# Patient Record
Sex: Male | Born: 1955 | Race: White | Hispanic: No | Marital: Married | Smoking: Current every day smoker
Health system: Southern US, Community
[De-identification: ages and names within clinical notes are randomized; demographics above are authoritative.]

## PROBLEM LIST (undated history)

## (undated) DIAGNOSIS — I10 Essential (primary) hypertension: Secondary | ICD-10-CM

## (undated) DIAGNOSIS — M5126 Other intervertebral disc displacement, lumbar region: Secondary | ICD-10-CM

---

## 2005-12-02 ENCOUNTER — Ambulatory Visit: Payer: Self-pay | Admitting: Family Medicine

## 2006-04-15 ENCOUNTER — Ambulatory Visit: Payer: Self-pay | Admitting: Family Medicine

## 2006-05-25 DIAGNOSIS — M545 Low back pain, unspecified: Secondary | ICD-10-CM | POA: Insufficient documentation

## 2006-05-25 DIAGNOSIS — F17200 Nicotine dependence, unspecified, uncomplicated: Secondary | ICD-10-CM | POA: Insufficient documentation

## 2007-01-18 ENCOUNTER — Ambulatory Visit: Payer: Self-pay | Admitting: Family Medicine

## 2007-01-18 ENCOUNTER — Encounter: Payer: Self-pay | Admitting: Family Medicine

## 2007-01-18 ENCOUNTER — Encounter: Admission: RE | Admit: 2007-01-18 | Discharge: 2007-01-18 | Payer: Self-pay | Admitting: Family Medicine

## 2007-01-18 DIAGNOSIS — M25579 Pain in unspecified ankle and joints of unspecified foot: Secondary | ICD-10-CM | POA: Insufficient documentation

## 2007-01-19 ENCOUNTER — Encounter: Payer: Self-pay | Admitting: Family Medicine

## 2007-01-19 LAB — CONVERTED CEMR LAB
Basophils Absolute: 0 10*3/uL (ref 0.0–0.1)
Basophils Relative: 1 % (ref 0–1)
Eosinophils Relative: 2 % (ref 0–5)
MCV: 98.6 fL (ref 78.0–100.0)
Monocytes Absolute: 0.6 10*3/uL (ref 0.2–0.7)
Neutro Abs: 4.7 10*3/uL (ref 1.7–7.7)
RBC: 4.37 M/uL (ref 4.22–5.81)
RDW: 12.5 % (ref 11.5–14.0)
WBC: 7.2 10*3/uL (ref 4.0–10.5)

## 2007-01-20 ENCOUNTER — Telehealth (INDEPENDENT_AMBULATORY_CARE_PROVIDER_SITE_OTHER): Payer: Self-pay | Admitting: *Deleted

## 2007-01-21 ENCOUNTER — Telehealth: Payer: Self-pay | Admitting: Family Medicine

## 2007-08-29 ENCOUNTER — Ambulatory Visit: Payer: Self-pay | Admitting: *Deleted

## 2007-09-01 ENCOUNTER — Telehealth: Payer: Self-pay | Admitting: Family Medicine

## 2007-09-05 ENCOUNTER — Telehealth: Payer: Self-pay | Admitting: Family Medicine

## 2007-09-05 DIAGNOSIS — R498 Other voice and resonance disorders: Secondary | ICD-10-CM | POA: Insufficient documentation

## 2007-09-30 ENCOUNTER — Encounter: Payer: Self-pay | Admitting: Family Medicine

## 2008-11-30 ENCOUNTER — Ambulatory Visit: Payer: Self-pay | Admitting: Family Medicine

## 2008-11-30 ENCOUNTER — Encounter: Admission: RE | Admit: 2008-11-30 | Discharge: 2008-11-30 | Payer: Self-pay | Admitting: Family Medicine

## 2008-12-03 ENCOUNTER — Encounter: Payer: Self-pay | Admitting: Family Medicine

## 2008-12-03 LAB — CONVERTED CEMR LAB
Basophils Relative: 1 % (ref 0–1)
Eosinophils Relative: 1 % (ref 0–5)
HCT: 45.8 % (ref 39.0–52.0)
MCHC: 33.4 g/dL (ref 30.0–36.0)
MCV: 102.2 fL — ABNORMAL HIGH (ref 78.0–100.0)
Monocytes Absolute: 1 10*3/uL (ref 0.1–1.0)
Monocytes Relative: 11 % (ref 3–12)
Neutro Abs: 6.8 10*3/uL (ref 1.7–7.7)
Platelets: 265 10*3/uL (ref 150–400)
Uric Acid, Serum: 7.5 mg/dL (ref 4.0–7.8)

## 2009-09-05 ENCOUNTER — Ambulatory Visit: Payer: Self-pay | Admitting: Family Medicine

## 2009-10-14 ENCOUNTER — Telehealth: Payer: Self-pay | Admitting: Family Medicine

## 2009-10-22 ENCOUNTER — Ambulatory Visit: Payer: Self-pay | Admitting: Family Medicine

## 2009-10-22 ENCOUNTER — Encounter: Admission: RE | Admit: 2009-10-22 | Discharge: 2009-10-22 | Payer: Self-pay | Admitting: Family Medicine

## 2009-10-22 DIAGNOSIS — M25569 Pain in unspecified knee: Secondary | ICD-10-CM | POA: Insufficient documentation

## 2010-09-16 NOTE — Assessment & Plan Note (Signed)
Summary: Sinusitis   Vital Signs:  Patient profile:   55 year old male Height:      65 inches Weight:      127 pounds BMI:     21.21 O2 Sat:      98 % on Room air Temp:     98.4 degrees F oral Pulse rate:   91 / minute BP sitting:   131 / 81  (left arm) Cuff size:   regular  Vitals Entered By: Kathlene November (September 05, 2009 12:50 PM)  O2 Flow:  Room air CC: head and sinus congestion, H/A, behind eyes hurt, some cough   Primary Care Provider:  Linford Arnold, C  CC:  head and sinus congestion, H/A, behind eyes hurt, and some cough.  History of Present Illness: head and sinus congestion, H/A, behind eyes hurt, some cough.  Day 6 of sxs. Pain mostly over nasal bridge. Lots of pressure in the head, worse with cough.  Tried several OTC products - no relief.  No fever or ST.  No GI sxs.    Current Medications (verified): 1)  Aleve 220 Mg Tabs (Naproxen Sodium) .... As Needed 2)  Goodys Body Pain 500-325 Mg Pack (Aspirin-Acetaminophen) .... Take As Needed  Allergies (verified): No Known Drug Allergies  Comments:  Nurse/Medical Assistant: The patient's medications and allergies were reviewed with the patient and were updated in the Medication and Allergy Lists. Kathlene November (September 05, 2009 12:52 PM)  Social History: Reviewed history from 05/25/2006 and no changes required. Personnel officer at [employer].  Smokes 2ppd/41yr, ETOH, no drus, 4 caffeinated drinks daily, weights/bike daily 3-4x/week.  Physical Exam  General:  Well-developed,well-nourished,in no acute distress; alert,appropriate and cooperative throughout examination Head:  Normocephalic and atraumatic without obvious abnormalities. No apparent alopecia or balding. Eyes:  No corneal or conjunctival inflammation noted. EOMI. Perrla.  Ears:  External ear exam shows no significant lesions or deformities.  Otoscopic examination reveals clear canals, tympanic membranes are intact bilaterally without bulging, retraction,  inflammation or discharge. Hearing is grossly normal bilaterally. Nose:  External nasal examination shows no deformity or inflammation. Nasal mucosa are pink and moist without lesions or exudates. Mouth:  Oral mucosa and oropharynx without lesions or exudates.  Teeth in good repair. Neck:  No deformities, masses, or tenderness noted. Lungs:  Normal respiratory effort, chest expands symmetrically. Lungs are clear to auscultation, no crackles or wheezes. Heart:  Normal rate and regular rhythm. S1 and S2 normal without gallop, murmur, click, rub or other extra sounds. Pulses:  Radial 2+  Skin:  no rashes.   Cervical Nodes:  No lymphadenopathy noted Psych:  Cognition and judgment appear intact. Alert and cooperative with normal attention span and concentration. No apparent delusions, illusions, hallucinations   Impression & Recommendations:  Problem # 1:  SINUSITIS - ACUTE-NOS (ICD-461.9)  His updated medication list for this problem includes:    Amoxicillin 500 Mg Cap (Amoxicillin) .Marland Kitchen... Take 1 capsule by mouth three times a day x 10 days  Instructed on treatment. Call if symptoms persist or worsen. Claerly smoking makes this worse. Pt understands.   Complete Medication List: 1)  Aleve 220 Mg Tabs (Naproxen sodium) .... As needed 2)  Goodys Body Pain 500-325 Mg Pack (Aspirin-acetaminophen) .... Take as needed 3)  Amoxicillin 500 Mg Cap (Amoxicillin) .... Take 1 capsule by mouth three times a day x 10 days Prescriptions: AMOXICILLIN 500 MG CAP (AMOXICILLIN) Take 1 capsule by mouth three times a day X 10 days  #30 x  0   Entered and Authorized by:   Nani Gasser MD   Signed by:   Nani Gasser MD on 09/05/2009   Method used:   Electronically to        [facility] [phone]* (retail)       [address] [place], [place]  [address]       Ph: [phone]       Fax: [phone]   RxID:   [id number]

## 2010-09-16 NOTE — Progress Notes (Signed)
Summary: Chantix  Phone Note Call from Patient Call back at Home Phone [phone]   Caller: Patient Call For: Jason Page Summary of Call: Company is starting a no smoking policy and wanted to know If he could have the Chantix called in on [facility] in [place] Initial call taken by: Kathlene November,  October 14, 2009 12:47 PM  Follow-up for Phone Call        Needs appt to discuss further. Unless we have discussed it before. I didn't see if documented.  Follow-up by: Jason Page,  October 14, 2009 12:53 PM  Additional Follow-up for Phone Call Additional follow up Details #1::        He said he had been on it before but did not stick with it but that his work will not let them smoke anywhere not any vehicles either so he feels now is the time and he said he would stick with it this time- has no choice Additional Follow-up by: Kathlene November,  October 14, 2009 12:54 PM    Additional Follow-up for Phone Call Additional follow up Details #2::    OK, no prob.  Have him f/u in 1 mo.  Follow-up by: Jason Page,  October 14, 2009 12:55 PM  New/Updated Medications: CHANTIX STARTING MONTH PAK 0.5 MG X 11 & 1 MG X 42 TABS (VARENICLINE TARTRATE) Take as directed Prescriptions: CHANTIX STARTING MONTH PAK 0.5 MG X 11 & 1 MG X 42 TABS (VARENICLINE TARTRATE) Take as directed  #1 pack x 0   Entered and Authorized by:   Jason Page   Signed by:   Jason Page on 10/14/2009   Method used:   Electronically to        [facility] [phone]* (retail)       [address] [place], [place]  [address]       Ph: [phone]       Fax: [phone]   RxID:   [id number]

## 2010-09-16 NOTE — Assessment & Plan Note (Signed)
Summary: SWOLLEN KNEE/   Vital Signs:  Patient profile:   55 year old male Height:      65 inches Weight:      128 pounds Pulse rate:   92 / minute BP sitting:   146 / 78  (left arm) Cuff size:   regular  Vitals Entered By: Kathlene November (October 22, 2009 3:37 PM) CC: right knee pain and swollen since started the 2nd round of Chantix   Primary Care Provider:  Linford Arnold, C  CC:  right knee pain and swollen since started the 2nd round of Chantix.  History of Present Illness: right knee pain and swollen since started the 2nd round of Chantix.  Once on second row of pills (2nd week) started noticing knee swelling. No trauma. top of knee  andmedial area is tender.  Taking excedrin for pain relief - helps some. Has gotten worse. STarted about 5 days ago.  Tried to rest and elevate it over the weekend.  Wose with acitivity. No real alleviating sxs.  Hx of gout.  No longer smoking on the Chantix and he is no longer drinking alcohol. Of note had a similar episode in the left ankle about a year ago with acute swelling with no trauma. At that time was treated with cochicine for presumptive gout and says the medication really helped but his serum uric acid levels were normal.  RA was mildy elevated.   Current Medications (verified): 1)  Aleve 220 Mg Tabs (Naproxen Sodium) .... As Needed 2)  Goodys Body Pain 500-325 Mg Pack (Aspirin-Acetaminophen) .... Take As Needed 3)  Chantix Starting Month Pak 0.5 Mg X 11 & 1 Mg X 42 Tabs (Varenicline Tartrate) .... Take As Directed  Allergies (verified): No Known Drug Allergies  Comments:  Nurse/Medical Assistant: The patient's medications and allergies were reviewed with the patient and were updated in the Medication and Allergy Lists. 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  Physical Exam  General:  Well-developed,well-nourished,in no acute distress; alert,appropriate and cooperative throughout examination Msk:  Righ knee with 2+ swelling mostly superior a ndmedial to the patella.  Tender over this area. No joint line tenderness. Increased warmth.  No skin lesions or erythema.  Decreased flexionsecondary to swelling. Unable to fully extend.  No laxity of the joint. Unable to get a good McMurrays.  Skin:  no rashes.   Psych:  Cognition and judgment appear intact. Alert and cooperative with normal attention span and concentration. No apparent delusions, illusions, hallucinations   Impression & Recommendations:  Problem # 1:  KNEE PAIN (ICD-719.46) Acute onset swelling and had similar sxs in his left ankle a year ago. I am still sus[icious of some rheum process or gout.  Will get xray to rule out fracture or foreign body.  If neg will refer to sports med for have fluid drawn off and possibly send for analysis for gout or pseudogout. He is not longer drinking so this helps. I don't think this is a reaction to chantix so continue for now especially since this is helping his 3 ppd habit. For now did refill the colchicine since he felt this provided dramatic relief with his ankle almost a year ago. Dont' take with any NSAIDs. Can take with Tyelnoll if needed. Elevate, ice, and compress.   His updated medication list for thisF  problem includes:    Aleve 220 Mg Tabs (Naproxen sodium) .Marland Kitchen... As needed    Goodys Body Pain 500-325 Mg Pack (Aspirin-acetaminophen) .Marland Kitchen... Take as needed  Orders: T-DG Knee 2 Views*R* ([id number])  Complete Medication List: 1)  Aleve 220 Mg Tabs (Naproxen sodium) .... As needed 2)  Goodys Body Pain 500-325 Mg Pack (Aspirin-acetaminophen) .... Take as needed 3)  Chantix Starting Month Pak 0.5 Mg X 11 & 1 Mg X 42 Tabs (Varenicline tartrate) .... Take as directed 4)  Colchicine 0.6 Mg Tabs (Colchicine) .... Start with 2 tabs by  mouth then  take  two times a day Prescriptions: COLCHICINE 0.6 MG TABS (COLCHICINE) start with 2 tabs by  mouth then take  two times a day  #20 x 0   Entered and Authorized by:   Nani Gasser MD   Signed by:   Nani Gasser MD on 10/22/2009   Method used:   Electronically to        [facility] [phone]* (retail)       [address] [place], [place]  [address]       Ph: [phone]       Fax: [phone]   RxID:   [id number]

## 2011-03-09 ENCOUNTER — Ambulatory Visit (INDEPENDENT_AMBULATORY_CARE_PROVIDER_SITE_OTHER): Payer: BC Managed Care – PPO | Admitting: Family Medicine

## 2011-03-09 ENCOUNTER — Encounter: Payer: Self-pay | Admitting: Family Medicine

## 2011-03-09 VITALS — BP 153/90 | HR 61 | Ht 65.0 in | Wt 127.0 lb

## 2011-03-09 DIAGNOSIS — M2550 Pain in unspecified joint: Secondary | ICD-10-CM

## 2011-03-09 MED ORDER — VARENICLINE TARTRATE 0.5 MG X 11 & 1 MG X 42 PO MISC: ORAL | Status: AC

## 2011-03-09 MED ORDER — DOXYCYCLINE HYCLATE 100 MG PO TABS: 100.0000 mg | ORAL_TABLET | Freq: Two times a day (BID) | ORAL | Status: AC

## 2011-03-09 NOTE — Progress Notes (Signed)
  Subjective:    Patient ID: Jason Page, Jason    DOB: [birth year], 55 y.o.   MRN: [id number]  HPI Says worked in the yard last night.    Review of Systems     Objective:   Physical Exam        Assessment & Plan:

## 2011-03-09 NOTE — Progress Notes (Signed)
  Subjective:    Patient ID: Jason Page, male    DOB: 1955-08-31, 55 y.o.   MRN: [id number]  HPI  Was working in the yard last night and this am woke up and his left hand first finger was very swollen and tender. Says couldn't even wear a work glove today and they are required to wear on the job.  Says not open wound. Never saw anything bite him. No hx of gout, etc.    Review of Systems     Objective:   Physical Exam    LT hand first finger has sig swelling over the dorsum of the DIP joint. Unalble to flex it secondary to swelling.  Slightly red, inc warmth.  No drainage or open wound.     Assessment & Plan:  DIP swelling and Pain - discussed that I am not sure if due to bug bite or not. Doesn't look like an abscess but it is very swollen. Will tx with doxy since right over the joint. Call if not improving or suddenly gets worse and will eval further with an xray. Consider may be an inflammatory condition not related to infection but only in single joint. Out of work for the rest of today. Recommend job where doesn't have to wear gloves for a couple of days until swelling is improved.

## 2011-03-12 ENCOUNTER — Ambulatory Visit
Admission: RE | Admit: 2011-03-12 | Discharge: 2011-03-12 | Disposition: A | Discharge status: Home / Self Care | Payer: BC Managed Care – PPO | Source: Ambulatory Visit | Attending: Family Medicine | Admitting: Family Medicine

## 2011-03-12 ENCOUNTER — Telehealth: Payer: Self-pay | Admitting: Family Medicine

## 2011-03-12 DIAGNOSIS — M79645 Pain in left finger(s): Secondary | ICD-10-CM

## 2011-03-12 NOTE — Telephone Encounter (Signed)
Pt called and said his LT pointer finger is worse than when he was seen. Painful, sore, swelled worse.  Was told to call if worsened.  Please advise. Plan:  Spoke with Dr. Linford Arnold and she would like the patient to go for xray for the finger pain of the LT pointer finger.  Pt informed and he wishes to go today.  Told him the order will be in the system and he can just go 8-5pm MON-FRI any day but would prefer he do today. Jarvis Newcomer, LPN [facility]

## 2011-03-13 ENCOUNTER — Telehealth: Payer: Self-pay | Admitting: Family Medicine

## 2011-03-13 DIAGNOSIS — M79646 Pain in unspecified finger(s): Secondary | ICD-10-CM

## 2011-03-13 NOTE — Telephone Encounter (Signed)
Pt called and left mess on triage nurse VM asking for a return call. Plan:  Pt call was returned and the nurse had to [facility] for the pt and left office number for him to call back at his convenience. Jarvis Newcomer, LPN [facility]

## 2011-03-13 NOTE — Telephone Encounter (Signed)
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

## 2011-03-16 ENCOUNTER — Telehealth: Payer: Self-pay | Admitting: Family Medicine

## 2011-03-16 NOTE — Telephone Encounter (Signed)
Pt called and said he would like to know what he can do about the swelling until his referral appt on Thursday. Plan:  Told pt may use cold paks not directly to skin several times a day for 15 minute intervals, and elevate the hand as well.  Pt didn't want anything for the pain. Jason Newcomer, LPN [facility]

## 2011-09-08 ENCOUNTER — Encounter: Payer: Self-pay | Admitting: Physician Assistant

## 2011-09-08 ENCOUNTER — Ambulatory Visit (INDEPENDENT_AMBULATORY_CARE_PROVIDER_SITE_OTHER): Payer: BC Managed Care – PPO | Admitting: Physician Assistant

## 2011-09-08 VITALS — BP 174/95 | HR 91 | Temp 98.4°F | Wt 131.0 lb

## 2011-09-08 DIAGNOSIS — F17200 Nicotine dependence, unspecified, uncomplicated: Secondary | ICD-10-CM

## 2011-09-08 DIAGNOSIS — Z720 Tobacco use: Secondary | ICD-10-CM

## 2011-09-08 DIAGNOSIS — I10 Essential (primary) hypertension: Secondary | ICD-10-CM

## 2011-09-08 DIAGNOSIS — J40 Bronchitis, not specified as acute or chronic: Secondary | ICD-10-CM

## 2011-09-08 MED ORDER — AZITHROMYCIN 250 MG PO TABS: ORAL_TABLET | ORAL | Status: AC

## 2011-09-08 NOTE — Progress Notes (Signed)
  Subjective:    Patient ID: Jason Page, male    DOB: 03/21/1956, 56 y.o.   MRN: [id number]  Cough This is a new problem. The current episode started 1 to 4 weeks ago. The problem has been gradually worsening. The problem occurs every few minutes. The cough is productive of sputum. Associated symptoms include chills, ear congestion, ear pain, headaches, a sore throat, shortness of breath, sweats and wheezing. Pertinent negatives include no chest pain, fever, heartburn, hemoptysis, myalgias, nasal congestion, postnasal drip, rash or rhinorrhea. The symptoms are aggravated by cold air. Risk factors for lung disease include smoking/tobacco exposure and occupational exposure. He has tried OTC cough suppressant for the symptoms. The treatment provided mild relief. There is no history of asthma, bronchitis, COPD or pneumonia.   Patient also complains of sinus pressure for 3 weeks. Started taking alka selters, nyquil, dayquil. Not helped. Past weekend stayed in bed. He has been hoarse for 2 days. He complains of a headache that will not go away. 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  He recently had work physical and blood pressure was normal. Decreased cigarretes from 3 packs a day to 4 a day since been on Chantix.       Review of Systems  Constitutional: Positive for chills. Negative for fever.  HENT: Positive for ear pain and sore throat. Negative for rhinorrhea and postnasal drip.   Respiratory: Positive for cough, shortness of breath and wheezing. Negative for hemoptysis.   Cardiovascular: Negative for chest pain.  Gastrointestinal: Negative for heartburn.  Musculoskeletal: Negative for myalgias.  Skin: Negative for rash.  Neurological: Positive for headaches.       Objective:   Physical Exam  Constitutional: He is oriented to person, place, and time. He appears well-developed and well-nourished.  HENT:  Head: Normocephalic and  atraumatic.  Right Ear: External ear normal.  Left Ear: External ear normal.  Nose: Nose normal.  Mouth/Throat: Oropharynx is clear and moist. No oropharyngeal exudate.       Dentures on top palate.  Eyes: Right eye exhibits no discharge. Left eye exhibits no discharge.  Neck: Normal range of motion.       Bilateral anterior cervical adenopathy without tenderness to palpation.  Cardiovascular: Normal rate and normal heart sounds.   Pulmonary/Chest: Effort normal. He has wheezes.       Bilateral wheezing throughout lung fields on expiration.  Lymphadenopathy:    He has cervical adenopathy.  Neurological: He is alert and oriented to person, place, and time.  Skin: Skin is warm and dry.  Psychiatric: He has a normal mood and affect. His behavior is normal.          Assessment & Plan:  Bronchitis- Pulse ox: 97%. Zpak was given. Symptomatic care for cough. Mucinex BID might help with some of his congestion.  Hypertension-Patient reported was checked a work physical recently and was normal. Encourage patient to check daily. If it continues to be high or he has a headache it can't get rid of  then he needs to schedule an appointment.   Tobacco Abuse- On Chantix. Doing well on 4 cigarretes daily. Encouraged patient to continue to decrease cig amounts everyday that this will affect his overall health positively.

## 2011-09-08 NOTE — Patient Instructions (Signed)
Out of work for tomorrow. Start Zpak. Can try Mucinex for sinus congestion. Call office if not improving Friday.

## 2011-10-13 LAB — LIPID PANEL
Cholesterol: 198 mg/dL (ref 0–200)
HDL: 57 mg/dL (ref 35–70)
LDL Cholesterol: 110 mg/dL
LDl/HDL Ratio: 1.9

## 2011-10-13 LAB — BASIC METABOLIC PANEL
BUN: 9 mg/dL (ref 4–21)
Glucose: 84 mg/dL
Potassium: 4.4 mmol/L (ref 3.4–5.3)

## 2011-11-20 ENCOUNTER — Telehealth: Payer: Self-pay | Admitting: *Deleted

## 2011-11-20 NOTE — Telephone Encounter (Signed)
Pt called bc he has chest pain and R arm pain. He states he's been waking up w/ heart burn and acid reflux. Pt states its a burning in his upper chest and throat and bad taste in his mouth. Pt also c/o R arm pain. Pt describes as hard lump near elbow and pain started after sleeping on arm wrong. I advised Pt to avoid fatty, acidic, spicy foods and late night eating. Also advised Pt to schedule OV for next week and if Sxs get worse or chest pain becomes severe go to UC or ED. Pt agreed and scheduled OV.

## 2011-11-23 ENCOUNTER — Encounter: Payer: Self-pay | Admitting: Family Medicine

## 2011-11-23 ENCOUNTER — Ambulatory Visit (INDEPENDENT_AMBULATORY_CARE_PROVIDER_SITE_OTHER): Payer: BC Managed Care – PPO | Admitting: Family Medicine

## 2011-11-23 VITALS — BP 145/77 | HR 86 | Temp 98.4°F | Ht 65.0 in | Wt 133.0 lb

## 2011-11-23 DIAGNOSIS — K219 Gastro-esophageal reflux disease without esophagitis: Secondary | ICD-10-CM

## 2011-11-23 MED ORDER — OMEPRAZOLE 40 MG PO CPDR: 40.0000 mg | DELAYED_RELEASE_CAPSULE | Freq: Every day | ORAL | Status: DC

## 2011-11-23 NOTE — Progress Notes (Signed)
  Subjective:    Patient ID: Jason Page, male    DOB: [birth year], 56 y.o.   MRN: [id number]  HPI Severe heart burn . Has quit smoking. Has been eaing a lot.  Heatburn has  Been waking him at night. Has tried TUMs and helped some.  No blood in the stool. No vomiting.  Can happen with activity.  Does drink caffeine.     Review of Systems     Objective:   Physical Exam  Constitutional: He is oriented to person, place, and time. He appears well-developed and well-nourished.  HENT:  Head: Normocephalic and atraumatic.  Cardiovascular: Normal rate, regular rhythm and normal heart sounds.   Pulmonary/Chest: Effort normal and breath sounds normal.  Abdominal: Soft. Bowel sounds are normal. He exhibits no distension and no mass. There is no tenderness. There is no rebound and no guarding.  Neurological: He is alert and oriented to person, place, and time.  Skin: Skin is warm and dry.  Psychiatric: He has a normal mood and affect. His behavior is normal.          Assessment & Plan:  Severe heartburn - will tx with dietary changes and PPI.  Will check for H pylori.  Start omeprazole 40 mg once a day. He does not have significant symptom relief within one week and please call the office. He'll take the medication for 6-8 weeks and then taper down. Discussed the importance of dietary changes or otherwise the symptoms will come right back as soon as he discontinues the medication.   He has had cholesterol blood work checked at work this year. Asked him to drop off a copy for [place] to have.

## 2011-11-23 NOTE — Patient Instructions (Signed)
Diet for GERD or PUD Nutrition therapy can help ease the discomfort of gastroesophageal reflux disease (GERD) and peptic ulcer disease (PUD).  HOME CARE INSTRUCTIONS   Eat your meals slowly, in a relaxed setting.   Eat 5 to 6 small meals per day.   If a food causes distress, stop eating it for a period of time.  FOODS TO AVOID  Coffee, regular or decaffeinated.   Cola beverages, regular or low calorie.   Tea, regular or decaffeinated.   Pepper.   Cocoa.   High fat foods, including meats.   Butter, margarine, hydrogenated oil (trans fats).   Peppermint or spearmint (if you have GERD).   Fruits and vegetables if not tolerated.   Alcohol.   Nicotine (smoking or chewing). This is one of the most potent stimulants to acid production in the gastrointestinal tract.   Any food that seems to aggravate your condition.  If you have questions regarding your diet, ask your caregiver or a registered dietitian. TIPS  Lying flat may make symptoms worse. Keep the head of your bed raised 6 to 9 inches (15 to 23 cm) by using a foam wedge or blocks under the legs of the bed.   Do not lay down until 3 hours after eating a meal.   Daily physical activity may help reduce symptoms.  MAKE SURE YOU:   Understand these instructions.   Will watch your condition.   Will get help right away if you are not doing well or get worse.  Document Released: 08/03/2005 Document Revised: 07/23/2011 Document Reviewed: 06/19/2011 Exit[employer] Patient Information 2012 [employer]. 

## 2011-11-23 NOTE — Progress Notes (Signed)
Addended by: Nani Gasser D on: 11/23/2011 04:00 PM   Modules accepted: Orders

## 2011-11-24 ENCOUNTER — Encounter: Payer: Self-pay | Admitting: *Deleted

## 2011-11-24 LAB — H. PYLORI ANTIBODY, IGG: H Pylori IgG: 0.53 {ISR}

## 2012-09-12 ENCOUNTER — Other Ambulatory Visit: Payer: Self-pay | Admitting: Sports Medicine

## 2012-09-12 ENCOUNTER — Ambulatory Visit (INDEPENDENT_AMBULATORY_CARE_PROVIDER_SITE_OTHER): Payer: BC Managed Care – PPO | Admitting: Sports Medicine

## 2012-09-12 ENCOUNTER — Ambulatory Visit (INDEPENDENT_AMBULATORY_CARE_PROVIDER_SITE_OTHER): Payer: BC Managed Care – PPO

## 2012-09-12 DIAGNOSIS — M25469 Effusion, unspecified knee: Secondary | ICD-10-CM

## 2012-09-12 DIAGNOSIS — M25569 Pain in unspecified knee: Secondary | ICD-10-CM

## 2012-09-12 DIAGNOSIS — M25561 Pain in right knee: Secondary | ICD-10-CM

## 2012-09-12 MED ORDER — DOXYCYCLINE HYCLATE 100 MG PO TABS: 100.0000 mg | ORAL_TABLET | Freq: Two times a day (BID) | ORAL | Status: AC

## 2012-09-12 NOTE — Progress Notes (Signed)
SPORTS MEDICINE CONSULTATION REPORT  Subjective:    I'm seeing this patient as a consultation for:  Dr. Linford Arnold  CC: Right knee pain  HPI: This is a very pleasant 57 year old who comes in with a several-day history of pain he localizes over his lateral aspect of the right knee. He notes swelling, and warmth, has not had any injuries. He has had an episode like this before, and did have an x-ray that showed a knee effusion.  He notes that he has also had gout in his foot, but was unaware as to whether he has had this in his knee.  He denies any fevers or chills. He denies mechanical symptoms. Tried some over-the-counter pain relievers which were ineffective. Pain is localized, doesn't radiate, it is severe.  Past medical history, Surgical history, Family history not pertinant except as noted below, Social history, Allergies, and medications have been entered into the medical record, reviewed, and no changes needed.   Review of Systems: No headache, visual changes, nausea, vomiting, diarrhea, constipation, dizziness, abdominal pain, skin rash, fevers, chills, night sweats, weight loss, swollen lymph nodes, body aches, joint swelling, muscle aches, chest pain, shortness of breath, mood changes, visual or auditory hallucinations.   Objective:   General: Well Developed, well nourished, and in no acute distress.  Neuro/Psych: Alert and oriented x3, extra-ocular muscles intact, able to move all 4 extremities, sensation grossly intact. Skin: Warm and dry, no rashes noted.  Respiratory: Not using accessory muscles, speaking in full sentences, trachea midline.  Cardiovascular: Pulses palpable, no extremity edema. Abdomen: Does not appear distended. Right Knee: The knee is warm to palpation with a mild effusion palpable. There is no induration. ROM full in flexion and extension and lower leg rotation. Ligaments with solid consistent endpoints including ACL, PCL, LCL, MCL. Negative Mcmurray's, Apley's,  and Thessalonian tests. Non painful patellar compression. Patellar glide without crepitus. Patellar and quadriceps tendons unremarkable. Hamstring and quadriceps strength is normal.   Procedure: Real-time Ultrasound Guided aspiration/injection of right knee Device: GE Logiq E  Ultrasound guided injection is preferred based studies that show increased duration, increased effect, greater accuracy, decreased procedural pain, increased response rate, and decreased cost with ultrasound guided versus blind injection.  Verbal informed consent obtained.  Time-out conducted.  Noted no overlying erythema, induration, or other signs of local infection.  Skin prepped in a sterile fashion.  Local anesthesia: Topical Ethyl chloride.  With sterile technique and under real time ultrasound guidance:  4 cc lidocaine with epinephrine infiltrated in the subcutaneous tissues for local anesthesia. An 18-gauge needle a 60 cc syringe was advanced under real-time guidance into the suprapatellar recess, 6 cc of straw-colored fluid was aspirated. Syringe was switched, and 2 cc Kenalog 40, 4 cc lidocaine injected into the knee joint. Fluid was sent off to the lab. Completed without difficulty  Pain immediately resolved suggesting accurate placement of the medication.  Advised to call if fevers/chills, erythema, induration, drainage, or persistent bleeding.  Images permanently stored and available for review in the ultrasound unit.  Impression: Technically successful ultrasound guided injection.  X-rays show no degenerative change, they do show an effusion.  Impression and Recommendations:   This case required medical decision making of moderate complexity.

## 2012-09-12 NOTE — Assessment & Plan Note (Addendum)
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 Aspiration, with fluid sent for cell count, culture, sensitivity, Gram stain, and crystal analysis. I injected his knee. I strapped the knee with Ace bandage. He will go for x-rays, I'm also checking uric acid levels and a CBC with differential. As there is only minimal evidence of a cellulitis,we are also going to add doxycycline. I would like to see him back in 3-4 weeks.

## 2012-09-13 LAB — CBC WITH DIFFERENTIAL/PLATELET
Basophils Absolute: 0.1 K/uL (ref 0.0–0.1)
Basophils Relative: 1 % (ref 0–1)
Eosinophils Absolute: 0.1 10*3/uL (ref 0.0–0.7)
Eosinophils Relative: 1 % (ref 0–5)
HCT: 40.5 % (ref 39.0–52.0)
Hemoglobin: 14.2 g/dL (ref 13.0–17.0)
Lymphocytes Relative: 20 % (ref 12–46)
Lymphs Abs: 1.8 10*3/uL (ref 0.7–4.0)
MCH: 32.9 pg (ref 26.0–34.0)
MCHC: 35.1 g/dL (ref 30.0–36.0)
MCV: 94 fL (ref 78.0–100.0)
Monocytes Absolute: 0.8 10*3/uL (ref 0.1–1.0)
Monocytes Relative: 8 % (ref 3–12)
Neutro Abs: 6.4 10*3/uL (ref 1.7–7.7)
Neutrophils Relative %: 70 % (ref 43–77)
Platelets: 249 K/uL (ref 150–400)
RBC: 4.31 MIL/uL (ref 4.22–5.81)
RDW: 13.3 % (ref 11.5–15.5)
WBC: 9 K/uL (ref 4.0–10.5)

## 2012-09-13 LAB — URIC ACID: Uric Acid, Serum: 5.9 mg/dL (ref 4.0–7.8)

## 2012-09-15 ENCOUNTER — Telehealth: Payer: Self-pay | Admitting: *Deleted

## 2012-09-15 NOTE — Telephone Encounter (Signed)
I was informed by Renae Fickle in lab today that they can't run the test on the joint fluid b/c [facility] told him it had to be in a green top heparin tube.

## 2012-09-15 NOTE — Telephone Encounter (Signed)
Message copied by Renea Ee on Thu Sep 15, 2012  1:33 PM ------      Message from: Monica Becton      Created: Tue Sep 13, 2012 11:09 AM      Regarding: FW: Cancellation of Order # [id number]       Hi Misty, please call lab and see why they cancelled these joint fluid tests?      -T            ----- Message -----         From: [facility]         Sent: 09/13/2012   3:32 AM           To: Monica Becton, MD      Subject: Cancellation of Order # [id number]                         Order number [id number] for the procedure CELL COUNT + DIFF,  W/       [facility] FLD [[id number]] has been canceled by [facility] [[id number]]. This procedure was ordered by Monica Becton, MD [[id number]] on Sep 12, 2012 for the patient       Jason Page [[id number]]. The reason for cancellation was       "None".

## 2012-09-16 LAB — BODY FLUID CULTURE
Gram Stain: NONE SEEN
Organism ID, Bacteria: NO GROWTH

## 2012-10-06 ENCOUNTER — Telehealth: Payer: Self-pay | Admitting: *Deleted

## 2012-10-06 MED ORDER — DOXYCYCLINE HYCLATE 100 MG PO TABS: 100.0000 mg | ORAL_TABLET | Freq: Two times a day (BID) | ORAL | Status: AC

## 2012-10-06 NOTE — Telephone Encounter (Signed)
Called in another week of doxy, return if no better after.

## 2012-10-06 NOTE — Telephone Encounter (Signed)
Pt.notified

## 2012-10-06 NOTE — Telephone Encounter (Signed)
Pt calls & states that you gave him an abx for his knee.  He states that it felt better while he was taking it but now that he's finished it's hurting bad again.  He has a f/u with you on Monday but is asking if you can possibly call him in something again? Please advise

## 2012-10-10 ENCOUNTER — Ambulatory Visit (INDEPENDENT_AMBULATORY_CARE_PROVIDER_SITE_OTHER): Payer: BC Managed Care – PPO | Admitting: Sports Medicine

## 2012-10-10 DIAGNOSIS — M25561 Pain in right knee: Secondary | ICD-10-CM

## 2012-10-10 DIAGNOSIS — M25569 Pain in unspecified knee: Secondary | ICD-10-CM

## 2012-10-10 NOTE — Assessment & Plan Note (Signed)
Completely resolved status post aspiration, injection, doxycycline. Return as needed.

## 2012-10-10 NOTE — Progress Notes (Signed)
  Subjective:    CC: Followup knee.  HPI: This pleasant gentleman comes back for followup of right knee pain. I aspirated and injected his knee at the last visit as well as placed on a short course of doxycycline as there was mild erythema. Culture results from the aspiration were negative. We were unable to perform crystal analysis with polarized microscopy. He returns today completely pain-free.  Past medical history, Surgical history, Family history not pertinant except as noted below, Social history, Allergies, and medications have been entered into the medical record, reviewed, and no changes needed.   Review of Systems: No headache, visual changes, nausea, vomiting, diarrhea, constipation, dizziness, abdominal pain, skin rash, fevers, chills, night sweats, weight loss, swollen lymph nodes, body aches, joint swelling, muscle aches, chest pain, shortness of breath, mood changes, visual or auditory hallucinations.   Objective:   General: Well Developed, well nourished, and in no acute distress.  Neuro/Psych: Alert and oriented x3, extra-ocular muscles intact, able to move all 4 extremities, sensation grossly intact. Skin: Warm and dry, no rashes noted.  Respiratory: Not using accessory muscles, speaking in full sentences, trachea midline.  Cardiovascular: Pulses palpable, no extremity edema. Abdomen: Does not appear distended. Right Knee: Normal to inspection with no erythema or effusion or obvious bony abnormalities. Palpation normal with no warmth, joint line tenderness, patellar tenderness, or condyle tenderness. ROM full in flexion and extension and lower leg rotation. Ligaments with solid consistent endpoints including ACL, PCL, LCL, MCL. Negative Mcmurray's, Apley's, and Thessalonian tests. Non painful patellar compression. Patellar glide without crepitus. Patellar and quadriceps tendons unremarkable. Hamstring and quadriceps strength is normal.  Impression and Recommendations:     This case required medical decision making of moderate complexity.

## 2013-03-21 ENCOUNTER — Ambulatory Visit (INDEPENDENT_AMBULATORY_CARE_PROVIDER_SITE_OTHER): Payer: BC Managed Care – PPO | Admitting: Sports Medicine

## 2013-03-21 ENCOUNTER — Encounter: Payer: Self-pay | Admitting: Sports Medicine

## 2013-03-21 VITALS — BP 157/81 | HR 81 | Wt 120.0 lb

## 2013-03-21 DIAGNOSIS — T148XXA Other injury of unspecified body region, initial encounter: Secondary | ICD-10-CM

## 2013-03-21 DIAGNOSIS — W5501XA Bitten by cat, initial encounter: Secondary | ICD-10-CM | POA: Insufficient documentation

## 2013-03-21 MED ORDER — DOXYCYCLINE HYCLATE 100 MG PO TABS: 100.0000 mg | ORAL_TABLET | Freq: Two times a day (BID) | ORAL | Status: AC

## 2013-03-21 NOTE — Progress Notes (Signed)
  Subjective:    CC: Bite  HPI: This pleasant 57 year old male was bitten by his wife's cat several days ago, he developed some swelling, redness, pain without constitutional symptoms, pain is localized, moderate, persistent without radiation, swelling is overall improving.  Past medical history, Surgical history, Family history not pertinant except as noted below, Social history, Allergies, and medications have been entered into the medical record, reviewed, and no changes needed.   Review of Systems: No fevers, chills, night sweats, weight loss, chest pain, or shortness of breath.   Objective:    General: Well Developed, well nourished, and in no acute distress.  Neuro: Alert and oriented x3, extra-ocular muscles intact, sensation grossly intact.  HEENT: Normocephalic, atraumatic, pupils equal round reactive to light, neck supple, no masses, no lymphadenopathy, thyroid nonpalpable.  Skin: Warm and dry, no rashes. There is evidence of a cat bite over his left arm, there is also some induration, erythema, and tenderness to palpation suggestive of a cellulitis. No lymphangitis visible or palpable. Cardiac: Regular rate and rhythm, no murmurs rubs or gallops, no lower extremity edema.  Respiratory: Clear to auscultation bilaterally. Not using accessory muscles, speaking in full sentences. Impression and Recommendations:

## 2013-03-21 NOTE — Patient Instructions (Addendum)
Cellulitis Cellulitis is an infection of the skin and the tissue beneath it. The infected area is usually red and tender. Cellulitis occurs most often in the arms and lower legs.  CAUSES  Cellulitis is caused by bacteria that enter the skin through cracks or cuts in the skin. The most common types of bacteria that cause cellulitis are Staphylococcus and Streptococcus. SYMPTOMS   Redness and warmth.  Swelling.  Tenderness or pain.  Fever. DIAGNOSIS  Your caregiver can usually determine what is wrong based on a physical exam. Blood tests may also be done. TREATMENT  Treatment usually involves taking an antibiotic medicine. HOME CARE INSTRUCTIONS   Take your antibiotics as directed. Finish them even if you start to feel better.  Keep the infected arm or leg elevated to reduce swelling.  Apply a warm cloth to the affected area up to 4 times per day to relieve pain.  Only take over-the-counter or prescription medicines for pain, discomfort, or fever as directed by your caregiver.  Keep all follow-up appointments as directed by your caregiver. SEEK MEDICAL CARE IF:   You notice red streaks coming from the infected area.  Your red area gets larger or turns dark in color.  Your bone or joint underneath the infected area becomes painful after the skin has healed.  Your infection returns in the same area or another area.  You notice a swollen bump in the infected area.  You develop new symptoms. SEEK IMMEDIATE MEDICAL CARE IF:   You have a fever.  You feel very sleepy.  You develop vomiting or diarrhea.  You have a general ill feeling (malaise) with muscle aches and pains. MAKE SURE YOU:   Understand these instructions.  Will watch your condition.  Will get help right away if you are not doing well or get worse. Document Released: 05/13/2005 Document Revised: 02/02/2012 Document Reviewed: 10/19/2011 [employer] Patient Information 2014 [employer].  

## 2013-03-21 NOTE — Assessment & Plan Note (Signed)
This has developed a mild superficial cellulitis. Was causative agent is Staph, though we do need to worry about pasteurella multocida. Doxycycline for 7 days, warm compresses, return as needed. Up-to-date on tetanus.

## 2013-09-13 ENCOUNTER — Telehealth: Payer: Self-pay | Admitting: *Deleted

## 2013-09-13 NOTE — Telephone Encounter (Signed)
Pt called and stated that he did a health screening at work and his bp was 172/100. He stated that he was wearing a few layers of clothing and wanted to know if this would cause an increase. I told him that it could possibly however, looking back his bp has been elevated. appt made to discuss.Audelia Hives [place]

## 2013-09-19 ENCOUNTER — Ambulatory Visit: Payer: BC Managed Care – PPO | Admitting: Family Medicine

## 2013-09-20 ENCOUNTER — Ambulatory Visit (INDEPENDENT_AMBULATORY_CARE_PROVIDER_SITE_OTHER): Payer: BC Managed Care – PPO | Admitting: Physician Assistant

## 2013-09-20 ENCOUNTER — Encounter: Payer: Self-pay | Admitting: Physician Assistant

## 2013-09-20 VITALS — BP 156/77 | HR 84 | Wt 122.0 lb

## 2013-09-20 DIAGNOSIS — Z720 Tobacco use: Secondary | ICD-10-CM

## 2013-09-20 DIAGNOSIS — F17200 Nicotine dependence, unspecified, uncomplicated: Secondary | ICD-10-CM

## 2013-09-20 DIAGNOSIS — I10 Essential (primary) hypertension: Secondary | ICD-10-CM

## 2013-09-20 MED ORDER — LISINOPRIL 20 MG PO TABS: 20.0000 mg | ORAL_TABLET | Freq: Every day | ORAL | Status: DC

## 2013-09-20 NOTE — Progress Notes (Signed)
   Subjective:    Patient ID: Jason Page, male    DOB: Aug 29, 1955, 58 y.o.   MRN: [id number]  HPI Patient is a 58 year old male who presents to the clinic to discuss elevated blood pressure. Patient has previously not been on any blood pressure medications. He had a health screening at work and his blood pressure was 171/100. I told him to followup with his doctor. He denies any chest pains, shortness of breath, vision changes. He has been having some headaches off and on but thought it was just do to stress and working. He denies any formal exercise but does stay very active at work. He currently smokes a pack a day. He reports that this is significantly less than 6 months ago. He is trying to decrease his smoking on his own.   Review of Systems     Objective:   Physical Exam  Constitutional: He is oriented to person, place, and time. He appears well-developed and well-nourished.  HENT:  Head: Normocephalic and atraumatic.  Cardiovascular: Normal rate, regular rhythm and normal heart sounds.   Pulmonary/Chest: Effort normal and breath sounds normal. He has no wheezes.  Neurological: He is alert and oriented to person, place, and time.  Skin: Skin is dry.  Psychiatric: He has a normal mood and affect. His behavior is normal.          Assessment & Plan:  Hypertension-patient was started on lisinopril 20 mg daily. Discussed with patient the importance of low salt diet. Patient was given handout. We will check CMP today to look at liver and kidney. Followup with Dr. Charise Carwin in 4 weeks for recheck. Patient aware that smoking cessation could also help to lower blood pressure.  Tobacco abuse-patient is in the process of decreasing the amount of cigarettes a day. Patient is currently at one pack a day. Encouraged patient to decrease by one cigarette every other day and see how that goes and will followup at next visit.

## 2013-09-20 NOTE — Patient Instructions (Signed)

## 2013-09-21 LAB — COMPLETE METABOLIC PANEL WITH GFR
ALBUMIN: 4.7 g/dL (ref 3.5–5.2)
ALT: 22 U/L (ref 0–53)
AST: 30 U/L (ref 0–37)
Alkaline Phosphatase: 67 U/L (ref 39–117)
BUN: 16 mg/dL (ref 6–23)
CALCIUM: 10.1 mg/dL (ref 8.4–10.5)
CHLORIDE: 96 meq/L (ref 96–112)
CO2: 30 mEq/L (ref 19–32)
Creat: 0.72 mg/dL (ref 0.50–1.35)
GFR, Est Non African American: >89 mL/min
Glucose, Bld: 113 mg/dL — ABNORMAL HIGH (ref 70–99)
POTASSIUM: 4.5 meq/L (ref 3.5–5.3)
Sodium: 137 mEq/L (ref 135–145)
TOTAL PROTEIN: 7.4 g/dL (ref 6.0–8.3)
Total Bilirubin: 0.5 mg/dL (ref 0.2–1.2)

## 2013-10-19 ENCOUNTER — Ambulatory Visit (INDEPENDENT_AMBULATORY_CARE_PROVIDER_SITE_OTHER): Payer: BC Managed Care – PPO | Admitting: Family Medicine

## 2013-10-19 ENCOUNTER — Encounter: Payer: Self-pay | Admitting: Family Medicine

## 2013-10-19 VITALS — BP 142/78 | HR 76 | Temp 99.5°F | Ht 65.0 in | Wt 122.0 lb

## 2013-10-19 DIAGNOSIS — I10 Essential (primary) hypertension: Secondary | ICD-10-CM

## 2013-10-19 DIAGNOSIS — F17200 Nicotine dependence, unspecified, uncomplicated: Secondary | ICD-10-CM

## 2013-10-19 DIAGNOSIS — Z720 Tobacco use: Secondary | ICD-10-CM

## 2013-10-19 MED ORDER — LISINOPRIL 40 MG PO TABS: 40.0000 mg | ORAL_TABLET | Freq: Every day | ORAL | Status: DC

## 2013-10-19 NOTE — Progress Notes (Signed)
   Subjective:    Patient ID: Jason Page, male    DOB: Aug 26, 1955, 58 y.o.   MRN: [id number]  HPI HTN -  Pt denies chest pain, SOB, dizziness, or heart palpitations.  Taking meds as directed w/o problems.  Denies medication side effects.  Has cut out caffeine too. Was drinking 12 mountain dews per day.    Tob abuse - he is suing e-cigs and has been weaning down.     Review of Systems     Objective:   Physical Exam  Constitutional: He is oriented to person, place, and time. He appears well-developed and well-nourished.  HENT:  Head: Normocephalic and atraumatic.  Cardiovascular: Normal rate, regular rhythm and normal heart sounds.   No carotid bruits.   Pulmonary/Chest: Effort normal and breath sounds normal.  Neurological: He is alert and oriented to person, place, and time.  Skin: Skin is warm and dry.  Psychiatric: He has a normal mood and affect. His behavior is normal.          Assessment & Plan:  HTN - Much improved.  He is on fantastic job with cutting out caffeine from his diet. Congratulated him on doing so. We'll increase lisinopril to 40 mg. Check BMP today. Followup in 2 months.  tob abuse - continue work on weaning nicotine with the cigarettes. Again congratulated him on doing so. She's doing a fantastic job.

## 2013-10-20 ENCOUNTER — Other Ambulatory Visit: Payer: Self-pay | Admitting: *Deleted

## 2013-10-20 DIAGNOSIS — E871 Hypo-osmolality and hyponatremia: Secondary | ICD-10-CM

## 2013-10-20 LAB — BASIC METABOLIC PANEL WITH GFR
BUN: 18 mg/dL (ref 6–23)
CO2: 29 mEq/L (ref 19–32)
Calcium: 10 mg/dL (ref 8.4–10.5)
Chloride: 95 mEq/L — ABNORMAL LOW (ref 96–112)
Creat: 0.71 mg/dL (ref 0.50–1.35)
GFR, Est African American: >89 mL/min
GFR, Est Non African American: >89 mL/min
Glucose, Bld: 79 mg/dL (ref 70–99)
Potassium: 4.6 mEq/L (ref 3.5–5.3)
Sodium: 132 mEq/L — ABNORMAL LOW (ref 135–145)

## 2013-12-19 ENCOUNTER — Ambulatory Visit: Payer: BC Managed Care – PPO | Admitting: Family Medicine

## 2013-12-19 DIAGNOSIS — Z0289 Encounter for other administrative examinations: Secondary | ICD-10-CM

## 2014-05-25 ENCOUNTER — Emergency Department (INDEPENDENT_AMBULATORY_CARE_PROVIDER_SITE_OTHER)
Admission: EM | Admit: 2014-05-25 | Discharge: 2014-05-25 | Disposition: A | Discharge status: Home / Self Care | Payer: BC Managed Care – PPO | Source: Home / Self Care | Attending: Emergency Medicine | Admitting: Emergency Medicine

## 2014-05-25 ENCOUNTER — Encounter: Payer: Self-pay | Admitting: Emergency Medicine

## 2014-05-25 DIAGNOSIS — S39012A Strain of muscle, fascia and tendon of lower back, initial encounter: Secondary | ICD-10-CM

## 2014-05-25 HISTORY — DX: Essential (primary) hypertension: I10

## 2014-05-25 HISTORY — DX: Other intervertebral disc displacement, lumbar region: M51.26

## 2014-05-25 MED ORDER — MELOXICAM 15 MG PO TABS: 15.0000 mg | ORAL_TABLET | Freq: Every day | ORAL | Status: DC

## 2014-05-25 MED ORDER — HYDROCODONE-ACETAMINOPHEN 5-325 MG PO TABS
1.0000 | ORAL_TABLET | ORAL | Status: DC | PRN
Start: 2014-05-25 — End: 2016-03-23

## 2014-05-25 MED ORDER — PREDNISONE (PAK) 10 MG PO TABS: ORAL_TABLET | ORAL | Status: DC

## 2014-05-25 NOTE — ED Notes (Signed)
Jason Page c/o lumbar back pain x 3 days that started off "feeling like a knot". He has been moving furniture to place flooring @ home. Hx of 3 herniated disc, received epidural injections.

## 2014-05-25 NOTE — ED Provider Notes (Signed)
CSN: [id number]     Arrival date & time 05/25/14  1200 History   First MD Initiated Contact with Patient 05/25/14 1204     Chief Complaint  Patient presents with  . Back Pain   (Consider location/radiation/quality/duration/timing/severity/associated sxs/prior Treatment) HPI Was doing well until 3 days ago, was moving furniture at home and felt acute moderate to severe mid lumbar and left lumbar pain sharp, stabbing, 5/10 intensity. Aleve helped a little but it's not helping as much now. Pain worsens by moving or bending . Radiates to the left buttock but not beyond.. No paresthesias or weakness. No bowel or bladder dysfunction or incontinence.  Past medical history of 3 herniated discs about 10 years ago, he states he received epidural injections for this 10 years ago and that resolved his back problem then.--Up until 3 days ago, he was functioning well. Past Medical History  Diagnosis Date  . Hypertension   . Lumbar herniated disc    History reviewed. No pertinent past surgical history. Family History  Problem Relation Age of Onset  . Heart attack Father    History  Substance Use Topics  . Smoking status: Current Every Day Smoker -- 1.50 packs/day    Types: Cigarettes  . Smokeless tobacco: Never Used  . Alcohol Use: Yes    Review of Systems  All other systems reviewed and are negative.   Allergies  Review of patient's allergies indicates no known allergies.  Home Medications   Prior to Admission medications   Medication Sig Start Date End Date Taking? Authorizing Provider  HYDROcodone-acetaminophen (NORCO/VICODIN) 5-325 MG per tablet Take 1-2 tablets by mouth every 4 (four) hours as needed for severe pain. Take with food. 05/25/14   Jacqulyn Cane, MD  lisinopril (PRINIVIL,ZESTRIL) 40 MG tablet Take 1 tablet (40 mg total) by mouth daily. 10/19/13   Hali Marry, MD  meloxicam (MOBIC) 15 MG tablet Take 1 tablet (15 mg total) by mouth daily. For Pain/Inflammation. Take  with food. 05/25/14   Jacqulyn Cane, MD  predniSONE (STERAPRED UNI-PAK) 10 MG tablet Take as directed for 6 days.--Take 6 on day 1, 5 on day 2, 4 on day 3, then 3 tablets on day 4, then 2 tablets on day 5, then 1 on day 6. 05/25/14   Jacqulyn Cane, MD   BP 172/81  Pulse 90  Resp 16  Ht 5\' 5"  (1.651 m)  Wt 118 lb (53.524 kg)  BMI 19.64 kg/m2  SpO2 99% Physical Exam  Nursing note and vitals reviewed. Constitutional: He is oriented to person, place, and time. He appears well-developed and well-nourished. He is cooperative.  Non-toxic appearance. He appears distressed (Appears uncomfortable from low back pain.).  HENT:  Head: Normocephalic and atraumatic.  Mouth/Throat: Oropharynx is clear and moist.  Eyes: EOM are normal. Pupils are equal, round, and reactive to light. No scleral icterus.  Neck: Neck supple.  Cardiovascular: Regular rhythm and normal heart sounds.   Pulmonary/Chest: Effort normal and breath sounds normal. No respiratory distress. He has no wheezes. He has no rales. He exhibits no tenderness.  Abdominal: Soft. There is no tenderness.  Musculoskeletal:       Right hip: Normal.       Left hip: Normal.       Cervical back: He exhibits no tenderness.       Thoracic back: He exhibits no tenderness.       Lumbar back: He exhibits decreased range of motion, tenderness and spasm. He exhibits no bony tenderness, no  swelling, no edema, no deformity, no laceration and normal pulse.  Negative Right straight leg-raise test. Negative Left straight leg-raise test.  Negative Right Saralyn Pilar test. Negative Left Saralyn Pilar test.  No specific point tenderness over lumbar spine, but general and left paralumbar tenderness and spasm    Neurological: He is alert and oriented to person, place, and time. He has normal strength. He displays no atrophy, no tremor and normal reflexes. No cranial nerve deficit or sensory deficit. He exhibits normal muscle tone. Gait normal.  Reflex Scores:      Patellar  reflexes are 2+ on the right side and 2+ on the left side.      Achilles reflexes are 2+ on the right side and 2+ on the left side. Skin: Skin is warm, dry and intact. No lesion and no rash noted.  Psychiatric: He has a normal mood and affect.    ED Course  Procedures (including critical care time) Labs Review Labs Reviewed - No data to display  Imaging Review No results found.   MDM   1. Lumbar strain, initial encounter    no focal neurologic findings. Likely lumbar strain, but also in the differential is left sciatica as there is some tenderness left sciatic notch. Less likely that this is discogenic.  Workup and imaging options discussed.--He declined any x-rays or other imaging at this time. Treatment options discussed, as well as risks, benefits, alternatives. Patient voiced understanding and agreement with the following plans: Prednisone 10 mg-6 day Dosepak Small prescription for Vicodin, use as needed for acute severe pain only. Mobic 15 mg daily. Other nonpharmacologic measures and information given. Note written to excuse from work through Monday 10/12. Followup with PCP or Dr. Darene Lamer. if no better 3 days, sooner if worse or new symptoms. Precautions discussed. Red flags discussed. Questions invited and answered. Patient voiced understanding and agreement.     Jacqulyn Cane, MD 05/25/14 1247

## 2014-10-08 ENCOUNTER — Ambulatory Visit (INDEPENDENT_AMBULATORY_CARE_PROVIDER_SITE_OTHER): Payer: BLUE CROSS/BLUE SHIELD | Admitting: Family Medicine

## 2014-10-08 ENCOUNTER — Encounter: Payer: Self-pay | Admitting: Family Medicine

## 2014-10-08 VITALS — BP 178/104 | HR 94 | Wt 122.0 lb

## 2014-10-08 DIAGNOSIS — S39012A Strain of muscle, fascia and tendon of lower back, initial encounter: Secondary | ICD-10-CM

## 2014-10-08 MED ORDER — TRAMADOL HCL 50 MG PO TABS: 50.0000 mg | ORAL_TABLET | Freq: Three times a day (TID) | ORAL | Status: DC | PRN

## 2014-10-08 MED ORDER — METHOCARBAMOL 500 MG PO TABS: 500.0000 mg | ORAL_TABLET | Freq: Three times a day (TID) | ORAL | Status: DC | PRN

## 2014-10-08 NOTE — Progress Notes (Signed)
CC: Jason Page is a 59 y.o. male is here for Back Pain   Subjective: HPI:  Complains of right lower back pain that is been present since Saturday night. Seems to be persistent ever since onset. Has not responded to 4 Aleve over the course of the day. Nothing seems to make it better other than sitting still. It is worse with bending over or with sitting up quickly. It is worse with sitting for long periods of time or trying to get into his car. It is nonradiating is never had this before it feels much different than his back pain in the past which required steroid injections. No interventions other than that described above. He denies weakness or motor or sensory disturbances in either leg. No saddle paresthesias or bowel or bladder incontinence. Symptoms are moderate to severe in severity. He was lifting furniture that he admits was quite heavy earlier in the day on Saturday. No acute onset of pain it was more gradual as the day progressed.   Review Of Systems Outlined In HPI  Past Medical History  Diagnosis Date  . Hypertension   . Lumbar herniated disc     No past surgical history on file. Family History  Problem Relation Age of Onset  . Heart attack Father     History   Social History  . Marital Status: Married    Spouse Name: N/A  . Number of Children: N/A  . Years of Education: N/A   Occupational History  . Not on file.   Social History Main Topics  . Smoking status: Current Every Day Smoker -- 1.50 packs/day    Types: Cigarettes  . Smokeless tobacco: Never Used  . Alcohol Use: Yes  . Drug Use: No  . Sexual Activity: Not on file   Other Topics Concern  . Not on file   Social History Narrative     Objective: BP 178/104 mmHg  Pulse 94  Wt 122 lb (55.339 kg)  Vital signs reviewed. General: Alert and Oriented, No Acute Distress HEENT: Pupils equal, round, reactive to light. Conjunctivae clear.  External ears unremarkable.  Moist mucous membranes. Lungs:  Clear and comfortable work of breathing, speaking in full sentences without accessory muscle use. Cardiac: Regular rate and rhythm.  Neuro: CN II-XII grossly intact, gait normal. L4 and S1 DTR 2 over 4 bilaterally Extremities: No peripheral edema.  Strong peripheral pulses. Full range of motion in both lower extremities Back: No midline spinous process tenderness, no overlying skin changes at the site of discomfort, pain is localized to just above the posterior pelvic brim on the right when the paraspinal muscle sure is palpated. Full range of motion and strength in all 3 planes of the lumbar spine.  Mental Status: No depression, anxiety, nor agitation. Logical though process. Skin: Warm and dry.  Assessment & Plan: Jason Page was seen today for back pain.  Diagnoses and all orders for this visit:  Back strain, initial encounter  Other orders -     traMADol (ULTRAM) 50 MG tablet; Take 1 tablet (50 mg total) by mouth every 8 (eight) hours as needed. -     methocarbamol (ROBAXIN) 500 MG tablet; Take 1 tablet (500 mg total) by mouth every 8 (eight) hours as needed for muscle spasms.   Back strain: Sore tramadol and Robaxin. Sedation warning provided. Discussed that this should heal on itself over the next week or 2. Avoid heavy lifting, follow up with PCP in the near future regarding blood pressure  Return if symptoms worsen or fail to improve.

## 2015-04-11 ENCOUNTER — Ambulatory Visit (INDEPENDENT_AMBULATORY_CARE_PROVIDER_SITE_OTHER): Payer: BLUE CROSS/BLUE SHIELD | Admitting: Family Medicine

## 2015-04-11 ENCOUNTER — Encounter: Payer: Self-pay | Admitting: Family Medicine

## 2015-04-11 VITALS — BP 163/99 | HR 93 | Wt 117.0 lb

## 2015-04-11 DIAGNOSIS — H00016 Hordeolum externum left eye, unspecified eyelid: Secondary | ICD-10-CM

## 2015-04-11 MED ORDER — POLYMYXIN B-TRIMETHOPRIM 10000-0.1 UNIT/ML-% OP SOLN: 2.0000 [drp] | OPHTHALMIC | Status: DC

## 2015-04-11 NOTE — Progress Notes (Signed)
CC: Jason Page is a 59 y.o. male is here for eyelid irritation   Subjective: HPI:  Left eye pain described only has pain, mild in severity, worse and present only when blinking has been present for the last week and a half. Slowly getting worse. No benefit from hot compresses no other interventions as of yet. He denies any photophobia, vision loss, nor discharge from the eye. He states he feels normal other than the eye discomfort. He's noticed some swelling on the upper and lower lid of the eye. He denies any recent trauma to the eye and wear safety glasses every several seconds when he is at work. He's had a piece of metal on the surface of his eye in the past and states that that pain was far worse than his discomfort today. Denies fevers, chills, nor headache   Review Of Systems Outlined In HPI  Past Medical History  Diagnosis Date  . Hypertension   . Lumbar herniated disc     No past surgical history on file. Family History  Problem Relation Age of Onset  . Heart attack Father     Social History   Social History  . Marital Status: Married    Spouse Name: N/A  . Number of Children: N/A  . Years of Education: N/A   Occupational History  . Not on file.   Social History Main Topics  . Smoking status: Current Every Day Smoker -- 1.50 packs/day    Types: Cigarettes  . Smokeless tobacco: Never Used  . Alcohol Use: Yes  . Drug Use: No  . Sexual Activity: Not on file   Other Topics Concern  . Not on file   Social History Narrative     Objective: BP 163/99 mmHg  Pulse 93  Wt 117 lb (53.071 kg)  General: Alert and Oriented, No Acute Distress HEENT: Pupils equal, round, reactive to light. Conjunctivae clear. Fluoricen staining and examination under ultraviolet lamp shows no corneal abnormality or scratch. Anterior chamber of the left eye is open without debris. Vision is grossly intact. 3 mm circular swelling just proximal to the middle most eyelash. Lungs: Clear and  comfortable work of breathing Cardiac: Regular rate and rhythm.  Extremities: No peripheral edema.  Strong peripheral pulses.  Mental Status: No depression, anxiety, nor agitation. Skin: Warm and dry.  Assessment & Plan: [name] was seen today for eyelid irritation.  Diagnoses and all orders for this visit:  Stye, left -     trimethoprim-polymyxin b (POLYTRIM) ophthalmic solution; Place 2 drops into the left eye every 4 (four) hours. Use only white awake.  For a total of ten days.   Stye: Continue hot compresses and add baby shampoo to wash rag, use 4 times a day for up to 5 minutes. Start Polytrim. Signs and symptoms requring emergent/urgent reevaluation were discussed with the patient.  Return if symptoms worsen or fail to improve.

## 2015-12-06 ENCOUNTER — Encounter: Payer: Self-pay | Admitting: Physician Assistant

## 2015-12-06 ENCOUNTER — Ambulatory Visit (INDEPENDENT_AMBULATORY_CARE_PROVIDER_SITE_OTHER): Payer: BLUE CROSS/BLUE SHIELD | Admitting: Physician Assistant

## 2015-12-06 VITALS — BP 158/75 | HR 85 | Ht 65.0 in | Wt 118.0 lb

## 2015-12-06 DIAGNOSIS — H109 Unspecified conjunctivitis: Secondary | ICD-10-CM | POA: Diagnosis not present

## 2015-12-06 DIAGNOSIS — H00019 Hordeolum externum unspecified eye, unspecified eyelid: Secondary | ICD-10-CM | POA: Insufficient documentation

## 2015-12-06 DIAGNOSIS — H1013 Acute atopic conjunctivitis, bilateral: Secondary | ICD-10-CM | POA: Diagnosis not present

## 2015-12-06 DIAGNOSIS — H00013 Hordeolum externum right eye, unspecified eyelid: Secondary | ICD-10-CM

## 2015-12-06 MED ORDER — AZELASTINE HCL 0.05 % OP SOLN: 1.0000 [drp] | Freq: Two times a day (BID) | OPHTHALMIC | Status: DC

## 2015-12-06 MED ORDER — CIPROFLOXACIN HCL 0.3 % OP SOLN: 1.0000 [drp] | OPHTHALMIC | Status: DC

## 2015-12-06 NOTE — Patient Instructions (Signed)

## 2015-12-06 NOTE — Progress Notes (Signed)
   Subjective:    Patient ID: Jason Page, male    DOB: 10/06/1955, 60 y.o.   MRN: [id number]  HPI Pt is a 60 yo male who presents to the clinic with bilateral itchy, red, painful eyes. Right is worse than left. He works outside. His eyes have been itchy for last 2-3 weeks but pain started about 3 days ago. His eyes were matted shut this morning. No fever, chills, sinus pressure, cough, congestion. Tried a few warm compresses that is not helping.    Review of Systems  All other systems reviewed and are negative.      Objective:   Physical Exam  Constitutional: He is oriented to person, place, and time.  HENT:  Head: Normocephalic and atraumatic.  Right Ear: External ear normal.  Left Ear: External ear normal.  Nose: Nose normal.  Mouth/Throat: Oropharynx is clear and moist.  TM's clear.  Negative tenderness over sinuses.   Eyes: Right eye exhibits discharge. Left eye exhibits discharge.  Bilateral injected conjunctiva.  Right eye greenish, thick discharge. Swollen lower eyelid.  Sty present lower lid.  Neck: Normal range of motion. Neck supple.  Cardiovascular: Normal rate, regular rhythm and normal heart sounds.   Pulmonary/Chest: Effort normal and breath sounds normal. He has no wheezes.  Neurological: He is alert and oriented to person, place, and time.  Skin: Skin is dry.  Psychiatric: He has a normal mood and affect. His behavior is normal.          Assessment & Plan:  Bacterial conjunctivitis/right eye sty- i suspect allergic conjunctivitis and due to all the eye itching contaminated with bacteria. Sent cipro drops. optivar to start after finish cipro drop. Discussed hot compresses for sty. Call if not improving.

## 2016-03-23 ENCOUNTER — Encounter: Payer: Self-pay | Admitting: Family Medicine

## 2016-03-23 ENCOUNTER — Ambulatory Visit (INDEPENDENT_AMBULATORY_CARE_PROVIDER_SITE_OTHER): Payer: BLUE CROSS/BLUE SHIELD

## 2016-03-23 ENCOUNTER — Ambulatory Visit (INDEPENDENT_AMBULATORY_CARE_PROVIDER_SITE_OTHER): Payer: BLUE CROSS/BLUE SHIELD | Admitting: Family Medicine

## 2016-03-23 VITALS — BP 169/69 | HR 75 | Wt 122.0 lb

## 2016-03-23 DIAGNOSIS — M79672 Pain in left foot: Secondary | ICD-10-CM | POA: Diagnosis not present

## 2016-03-23 DIAGNOSIS — S92335A Nondisplaced fracture of third metatarsal bone, left foot, initial encounter for closed fracture: Secondary | ICD-10-CM

## 2016-03-23 DIAGNOSIS — S92345A Nondisplaced fracture of fourth metatarsal bone, left foot, initial encounter for closed fracture: Secondary | ICD-10-CM | POA: Diagnosis not present

## 2016-03-23 DIAGNOSIS — S92325A Nondisplaced fracture of second metatarsal bone, left foot, initial encounter for closed fracture: Secondary | ICD-10-CM

## 2016-03-23 DIAGNOSIS — W208XXA Other cause of strike by thrown, projected or falling object, initial encounter: Secondary | ICD-10-CM

## 2016-03-23 DIAGNOSIS — S92322A Displaced fracture of second metatarsal bone, left foot, initial encounter for closed fracture: Secondary | ICD-10-CM | POA: Diagnosis not present

## 2016-03-23 NOTE — Progress Notes (Signed)
Subjective:    CC: Left foot pain  HPI:  She comes in today complaining of left foot pain. He actually accidentally dropped a log on his left foot near his toes about a week ago. He says the toes were black and blue initially but it seemed to be getting better. He did have some difficulty putting weight on it but says he was managing. Then, 2 days ago on Saturday his son accidentally dropped a 2 x 12' board on his foot at the local hardware store. He says since then it has been more difficult to walk and has become more swollen again.He is using Aleve for pain.  BP (!) 169/69   Pulse 75   Wt 122 lb (55.3 kg)   BMI 20.30 kg/m     No Known Allergies  Past Medical History:  Diagnosis Date  . Hypertension   . Lumbar herniated disc     No past surgical history on file.  Social History   Social History  . Marital status: Married    Spouse name: N/A  . Number of children: N/A  . Years of education: N/A   Occupational History  . Not on file.   Social History Main Topics  . Smoking status: Current Every Day Smoker    Packs/day: 1.50    Types: Cigarettes  . Smokeless tobacco: Never Used  . Alcohol use Yes  . Drug use: No  . Sexual activity: Not on file   Other Topics Concern  . Not on file   Social History Narrative  . No narrative on file    Family History  Problem Relation Age of Onset  . Heart attack Father     Outpatient Encounter Prescriptions as of 03/23/2016  Medication Sig  . azelastine (OPTIVAR) 0.05 % ophthalmic solution Place 1 drop into both eyes 2 (two) times daily.  . ciprofloxacin (CILOXAN) 0.3 % ophthalmic solution Place 1 drop into both eyes every 2 (two) hours. Administer 1 drop, every 2 hours, while awake, for 2 days. Then 1 drop, every 4 hours, while awake, for the next 5 days.  . [DISCONTINUED] HYDROcodone-acetaminophen (NORCO/VICODIN) 5-325 MG per tablet Take 1-2 tablets by mouth every 4 (four) hours as needed for severe pain. Take with food.   . [DISCONTINUED] lisinopril (PRINIVIL,ZESTRIL) 40 MG tablet Take 1 tablet (40 mg total) by mouth daily.  . [DISCONTINUED] meloxicam (MOBIC) 15 MG tablet Take 1 tablet (15 mg total) by mouth daily. For Pain/Inflammation. Take with food.  . [DISCONTINUED] methocarbamol (ROBAXIN) 500 MG tablet Take 1 tablet (500 mg total) by mouth every 8 (eight) hours as needed for muscle spasms.  . [DISCONTINUED] traMADol (ULTRAM) 50 MG tablet Take 1 tablet (50 mg total) by mouth every 8 (eight) hours as needed.   No facility-administered encounter medications on file as of 03/23/2016.      Review of Systems: No fevers, chills, night sweats, weight loss, chest pain, or shortness of breath.   Objective:    General: Well Developed, well nourished, and in no acute distress.  Neuro: Alert and oriented x3, extra-ocular muscles intact, sensation grossly intact.  HEENT: Normocephalic, atraumatic  Skin: Warm and dry, no rashes. Ext: Left ankle with normal range of motion and nontender. The distal part of the foot is quite swollen. He is able to flex his toes though he has a lot of stiffness due to the swelling. He is very tender over the distal second, third, and fourth metatarsals. He's mildly tender over the proximal  end of the fifth metatarsal.   Impression and Recommendations:   Left foot pain-highly suspicious for fracture. Will get x-ray. Xray negative on my review. Will place in post op shoe. F/U in 2 weeks if not improving.  Aleve and tylenol for pain relief.

## 2016-03-23 NOTE — Patient Instructions (Signed)
Keep foot elevated and iced when able.   Wear post op shoe for next 2 weeks. If better and able to bear weight without pain then can stop wearing the post op shoe. If still having pain then follow up in the office Can take 2 Aleve twice a day with 2 extra strength tylenol Three times a day

## 2016-03-25 ENCOUNTER — Telehealth: Payer: Self-pay | Admitting: *Deleted

## 2016-03-25 NOTE — Telephone Encounter (Signed)
Pt returned call and he is doing fine in the post op shoe. Advised to call back if any changes. Pt voiced understanding and agreed.Audelia Hives Kosse

## 2016-09-07 ENCOUNTER — Ambulatory Visit (INDEPENDENT_AMBULATORY_CARE_PROVIDER_SITE_OTHER): Payer: BLUE CROSS/BLUE SHIELD

## 2016-09-07 ENCOUNTER — Encounter: Payer: Self-pay | Admitting: Family Medicine

## 2016-09-07 ENCOUNTER — Ambulatory Visit (INDEPENDENT_AMBULATORY_CARE_PROVIDER_SITE_OTHER): Payer: BLUE CROSS/BLUE SHIELD | Admitting: Family Medicine

## 2016-09-07 VITALS — BP 141/86 | HR 94 | Temp 97.6°F | Wt 117.0 lb

## 2016-09-07 DIAGNOSIS — J441 Chronic obstructive pulmonary disease with (acute) exacerbation: Secondary | ICD-10-CM

## 2016-09-07 DIAGNOSIS — R0602 Shortness of breath: Secondary | ICD-10-CM | POA: Diagnosis not present

## 2016-09-07 DIAGNOSIS — R05 Cough: Secondary | ICD-10-CM

## 2016-09-07 MED ORDER — ALBUTEROL SULFATE HFA 108 (90 BASE) MCG/ACT IN AERS: 2.0000 | INHALATION_SPRAY | Freq: Four times a day (QID) | RESPIRATORY_TRACT | 0 refills | Status: DC | PRN

## 2016-09-07 MED ORDER — AZITHROMYCIN 250 MG PO TABS: 250.0000 mg | ORAL_TABLET | Freq: Every day | ORAL | 0 refills | Status: DC

## 2016-09-07 MED ORDER — PREDNISONE 50 MG PO TABS: 50.0000 mg | ORAL_TABLET | Freq: Every day | ORAL | 0 refills | Status: DC

## 2016-09-07 NOTE — Progress Notes (Signed)
Jason Page is a 61 y.o. male who presents to [facility] today for shortness of breath. Patient over the last week or 2 has had worsening cough and congestion and wheezing. Last night he had difficulty breathing. He denies significant chest pain or palpitations or leg swelling. He is a regular smoker but has not been diagnosed with COPD. He denies fevers or chills vomiting or diarrhea. He denies significant palpitations.   Past Medical History:  Diagnosis Date  . Hypertension   . Lumbar herniated disc    No past surgical history on file. Social History  Substance Use Topics  . Smoking status: Current Every Day Smoker    Packs/day: 1.50    Types: Cigarettes  . Smokeless tobacco: Never Used  . Alcohol use Yes   family history includes Heart attack in his father.  ROS as above:  Medications: Current Outpatient Prescriptions  Medication Sig Dispense Refill  . azelastine (OPTIVAR) 0.05 % ophthalmic solution Place 1 drop into both eyes 2 (two) times daily. 6 mL 11  . ciprofloxacin (CILOXAN) 0.3 % ophthalmic solution Place 1 drop into both eyes every 2 (two) hours. Administer 1 drop, every 2 hours, while awake, for 2 days. Then 1 drop, every 4 hours, while awake, for the next 5 days. 10 mL 0  . albuterol (PROVENTIL HFA;VENTOLIN HFA) 108 (90 Base) MCG/ACT inhaler Inhale 2 puffs into the lungs every 6 (six) hours as needed for wheezing or shortness of breath. 1 Inhaler 0  . azithromycin (ZITHROMAX) 250 MG tablet Take 1 tablet (250 mg total) by mouth daily. Take first 2 tablets together, then 1 every day until finished. 6 tablet 0  . predniSONE (DELTASONE) 50 MG tablet Take 1 tablet (50 mg total) by mouth daily. 5 tablet 0   No current facility-administered medications for this visit.    No Known Allergies  Health Maintenance Health Maintenance  Topic Date Due  .  Hepatitis C Screening  [birth year]  . HIV Screening  04/09/1971  . INFLUENZA VACCINE  03/17/2016  . ZOSTAVAX  04/08/2016  . COLONOSCOPY  08/17/2016  . TETANUS/TDAP  11/22/2021     Exam:  BP (!) 141/86   Pulse 94   Temp 97.6 F (36.4 C) (Oral)   Wt 117 lb (53.1 kg)   SpO2 98%   BMI 19.47 kg/m  Gen: Well NAD Nontoxic appearing HEENT: EOMI,  MMM no increased JVD present Lungs: Increased work of breathing with poor air movement bilaterally. Slight wheezing is present bilaterally. Heart: RRR no MRG Abd: NABS, Soft. Nondistended, Nontender Exts: Brisk capillary refill, warm and well perfused. No edema present  Patient was given a 2.5/0.5 mg DuoNeb nebulizer treatment and felt much better. He had improved air movement but worsening wheezing on lung exam following nebulizer treatment.  12-lead EKG shows normal sinus rhythm at 91 bpm. No ST segment elevation or depression. No significant Q waves. Borderline right atrial enlargement present.  No results found for this or any previous visit (from the past 72 hour(s)). Dg Chest 2 View  Result Date: 09/07/2016 CLINICAL DATA:  Shortness of breath. EXAM: CHEST  2 VIEW COMPARISON:  None. FINDINGS: The heart size and mediastinal contours are within normal limits. Both lungs are clear. No pneumothorax or pleural effusion is noted. The visualized skeletal structures are unremarkable. IMPRESSION: No active cardiopulmonary disease. Electronically Signed   By: Marijo Conception, M.D.   On: 09/07/2016 17:11  Assessment and Plan: 60 y.o. male with shortness of breath is very likely COPD related. He has a long history of smoking and had exam consistent with COPD exacerbation. He had considerable improvement following nebulizer treatment. He does not have lower extremity edema or increased JVD suggestive of heart failure.   We discussed options and plan for trial of prednisone and azithromycin and albuterol. Additionally we'll obtain limited workup  listed below. Discussed in detail precautions to return to the emergency room should he worsen.   Orders Placed This Encounter  Procedures  . DG Chest 2 View    Order Specific Question:   Reason for exam:    Answer:   Cough, assess intra-thoracic pathology    Order Specific Question:   Preferred imaging location?    Answer:   [facility]  . CBC  . COMPLETE METABOLIC PANEL WITH GFR  . B Nat Peptide  . EKG 12-Lead    Discussed warning signs or symptoms. Please see discharge instructions. Patient expresses understanding.

## 2016-09-07 NOTE — Patient Instructions (Signed)
Thank you for coming in today. Call or go to the emergency room if you get worse, have trouble breathing, have chest pains, or palpitations.  Take prednisone and azithromycin daily for 5 days.  Get labs and xray today.  Follow up with Dr Madilyn Fireman in 1-2 weeks.  Go to the ER if you get worse again.  Use albuterol about every 4-6 hours for the next few days and then as needed after that.   Chronic Obstructive Pulmonary Disease Chronic obstructive pulmonary disease (COPD) is a common lung condition in which airflow from the lungs is limited. COPD is a general term that can be used to describe many different lung problems that limit airflow, including both chronic bronchitis and emphysema. If you have COPD, your lung function will probably never return to normal, but there are measures you can take to improve lung function and make yourself feel better. What are the causes?  Smoking (common).  Exposure to secondhand smoke.  Genetic problems.  Chronic inflammatory lung diseases or recurrent infections. What are the signs or symptoms?  Shortness of breath, especially with physical activity.  Deep, persistent (chronic) cough with a large amount of thick mucus.  Wheezing.  Rapid breaths (tachypnea).  Gray or bluish discoloration (cyanosis) of the skin, especially in your fingers, toes, or lips.  Fatigue.  Weight loss.  Frequent infections or episodes when breathing symptoms become much worse (exacerbations).  Chest tightness. How is this diagnosed? Your health care provider will take a medical history and perform a physical examination to diagnose COPD. Additional tests for COPD may include:  Lung (pulmonary) function tests.  Chest X-ray.  CT scan.  Blood tests. How is this treated? Treatment for COPD may include:  Inhaler and nebulizer medicines. These help manage the symptoms of COPD and make your breathing more comfortable.  Supplemental oxygen. Supplemental oxygen is  only helpful if you have a low oxygen level in your blood.  Exercise and physical activity. These are beneficial for nearly all people with COPD.  Lung surgery or transplant.  Nutrition therapy to gain weight, if you are underweight.  Pulmonary rehabilitation. This may involve working with a team of health care providers and specialists, such as respiratory, occupational, and physical therapists. Follow these instructions at home:  Take all medicines (inhaled or pills) as directed by your health care provider.  Avoid over-the-counter medicines or cough syrups that dry up your airway (such as antihistamines) and slow down the elimination of secretions unless instructed otherwise by your health care provider.  If you are a smoker, the most important thing that you can do is stop smoking. Continuing to smoke will cause further lung damage and breathing trouble. Ask your health care provider for help with quitting smoking. He or she can direct you to community resources or hospitals that provide support.  Avoid exposure to irritants such as smoke, chemicals, and fumes that aggravate your breathing.  Use oxygen therapy and pulmonary rehabilitation if directed by your health care provider. If you require home oxygen therapy, ask your health care provider whether you should purchase a pulse oximeter to measure your oxygen level at home.  Avoid contact with individuals who have a contagious illness.  Avoid extreme temperature and humidity changes.  Eat healthy foods. Eating smaller, more frequent meals and resting before meals may help you maintain your strength.  Stay active, but balance activity with periods of rest. Exercise and physical activity will help you maintain your ability to do things you want  to do.  Preventing infection and hospitalization is very important when you have COPD. Make sure to receive all the vaccines your health care provider recommends, especially the pneumococcal  and influenza vaccines. Ask your health care provider whether you need a pneumonia vaccine.  Learn and use relaxation techniques to manage stress.  Learn and use controlled breathing techniques as directed by your health care provider. Controlled breathing techniques include: 1. Pursed lip breathing. Start by breathing in (inhaling) through your nose for 1 second. Then, purse your lips as if you were going to whistle and breathe out (exhale) through the pursed lips for 2 seconds. 2. Diaphragmatic breathing. Start by putting one hand on your abdomen just above your waist. Inhale slowly through your nose. The hand on your abdomen should move out. Then purse your lips and exhale slowly. You should be able to feel the hand on your abdomen moving in as you exhale.  Learn and use controlled coughing to clear mucus from your lungs. Controlled coughing is a series of short, progressive coughs. The steps of controlled coughing are: 1. Lean your head slightly forward. 2. Breathe in deeply using diaphragmatic breathing. 3. Try to hold your breath for 3 seconds. 4. Keep your mouth slightly open while coughing twice. 5. Spit any mucus out into a tissue. 6. Rest and repeat the steps once or twice as needed. Contact a health care provider if:  You are coughing up more mucus than usual.  There is a change in the color or thickness of your mucus.  Your breathing is more labored than usual.  Your breathing is faster than usual. Get help right away if:  You have shortness of breath while you are resting.  You have shortness of breath that prevents you from:  Being able to talk.  Performing your usual physical activities.  You have chest pain lasting longer than 5 minutes.  Your skin color is more cyanotic than usual.  You measure low oxygen saturations for longer than 5 minutes with a pulse oximeter. This information is not intended to replace advice given to you by your health care provider.  Make sure you discuss any questions you have with your health care provider. Document Released: 05/13/2005 Document Revised: 01/09/2016 Document Reviewed: 03/30/2013 Elsevier Interactive Patient Education  2017 [employer].

## 2016-09-08 LAB — COMPLETE METABOLIC PANEL WITH GFR
ALT: 62 U/L — AB (ref 9–46)
AST: 69 U/L — AB (ref 10–35)
Albumin: 4.6 g/dL (ref 3.6–5.1)
Alkaline Phosphatase: 61 U/L (ref 40–115)
BUN: 14 mg/dL (ref 7–25)
CHLORIDE: 94 mmol/L — AB (ref 98–110)
CO2: 27 mmol/L (ref 20–31)
CREATININE: 0.82 mg/dL (ref 0.70–1.25)
Calcium: 10.2 mg/dL (ref 8.6–10.3)
GFR, Est African American: >89 mL/min (ref >=60)
GFR, Est Non African American: >89 mL/min (ref >=60)
GLUCOSE: 80 mg/dL (ref 65–99)
Potassium: 5.1 mmol/L (ref 3.5–5.3)
SODIUM: 132 mmol/L — AB (ref 135–146)
Total Bilirubin: 1.1 mg/dL (ref 0.2–1.2)
Total Protein: 8.1 g/dL (ref 6.1–8.1)

## 2016-09-08 LAB — CBC
HCT: 44 % (ref 38.5–50.0)
Hemoglobin: 15.3 g/dL (ref 13.2–17.1)
MCH: 34.8 pg — ABNORMAL HIGH (ref 27.0–33.0)
MCHC: 34.8 g/dL (ref 32.0–36.0)
MCV: 100 fL (ref 80.0–100.0)
MPV: 9.5 fL (ref 7.5–12.5)
PLATELETS: 293 10*3/uL (ref 140–400)
RBC: 4.4 MIL/uL (ref 4.20–5.80)
RDW: 13 % (ref 11.0–15.0)
WBC: 10 10*3/uL (ref 3.8–10.8)

## 2016-09-08 LAB — BRAIN NATRIURETIC PEPTIDE: Brain Natriuretic Peptide: 16.8 pg/mL (ref <100)

## 2016-10-30 ENCOUNTER — Ambulatory Visit (INDEPENDENT_AMBULATORY_CARE_PROVIDER_SITE_OTHER): Payer: BLUE CROSS/BLUE SHIELD | Admitting: Sports Medicine

## 2016-10-30 ENCOUNTER — Encounter: Payer: Self-pay | Admitting: Sports Medicine

## 2016-10-30 DIAGNOSIS — L84 Corns and callosities: Secondary | ICD-10-CM | POA: Diagnosis not present

## 2016-10-30 MED ORDER — DOXYCYCLINE HYCLATE 100 MG PO TABS: 100.0000 mg | ORAL_TABLET | Freq: Two times a day (BID) | ORAL | 0 refills | Status: AC

## 2016-10-30 NOTE — Progress Notes (Signed)
   Subjective:    I'm seeing this patient as a consultation for:  Dr. Beatrice Lecher  CC: Left toe pain  HPI: For sometime now this pleasant 61 year old male has had a growth on the lateral aspect of his great toe, with increasing pain, it does tend to rub on his second toe and on his shoe. Symptoms are moderate, worsening, no radiation, no constitutional symptoms.  Past medical history:  Negative.  See flowsheet/record as well for more information.  Surgical history: Negative.  See flowsheet/record as well for more information.  Family history: Negative.  See flowsheet/record as well for more information.  Social history: Negative.  See flowsheet/record as well for more information.  Allergies, and medications have been entered into the medical record, reviewed, and no changes needed.   Review of Systems: No headache, visual changes, nausea, vomiting, diarrhea, constipation, dizziness, abdominal pain, skin rash, fevers, chills, night sweats, weight loss, swollen lymph nodes, body aches, joint swelling, muscle aches, chest pain, shortness of breath, mood changes, visual or auditory hallucinations.   Objective:   General: Well Developed, well nourished, and in no acute distress.  Neuro/Psych: Alert and oriented x3, extra-ocular muscles intact, able to move all 4 extremities, sensation grossly intact. Skin: Warm and dry, no rashes noted.  Respiratory: Not using accessory muscles, speaking in full sentences, trachea midline.  Cardiovascular: Pulses palpable, no extremity edema. Abdomen: Does not appear distended. Right Foot: No visible erythema or swelling. Range of motion is full in all directions. Strength is 5/5 in all directions. No hallux valgus. No pes cavus or pes planus. No abnormal callus noted. No pain over the navicular prominence, or base of fifth metatarsal. No tenderness to palpation of the calcaneal insertion of plantar fascia. No pain at the Achilles insertion. No  pain over the calcaneal bursa. No pain of the retrocalcaneal bursa. No tenderness to palpation over the tarsals, metatarsals, or phalanges. No hallux rigidus or limitus. No tenderness palpation over interphalangeal joints. No pain with compression of the metatarsal heads. Neurovascularly intact distally. There is a large callus on the lateral aspect of his great toe with mild surrounding erythema  Impression and Recommendations:   This case required medical decision making of moderate complexity.  Callus of right great toe Painful callus. This does appear slightly erythematous so I am going to do a course of doxycycline, he will try to sand this down with an [employer]. If insufficient improvement after 1 week I will do a surgical excision with primary closure.

## 2016-10-30 NOTE — Assessment & Plan Note (Signed)
Painful callus. This does appear slightly erythematous so I am going to do a course of doxycycline, he will try to sand this down with an [employer]. If insufficient improvement after 1 week I will do a surgical excision with primary closure.

## 2017-04-07 ENCOUNTER — Telehealth: Payer: Self-pay

## 2017-04-07 MED ORDER — SULFAMETHOXAZOLE-TRIMETHOPRIM 800-160 MG PO TABS: 1.0000 | ORAL_TABLET | Freq: Two times a day (BID) | ORAL | 0 refills | Status: DC

## 2017-04-07 NOTE — Telephone Encounter (Signed)
Yes, adding 7 days septra, and he can schedule a 30 min appt for excision at 5-7 days out.

## 2017-04-07 NOTE — Telephone Encounter (Signed)
Spoke with pt who states he still has the lesion on his big toe that isn't getting any better and is very painful. Pt states he's used the emery board to the point where he doesn't have enough callus there to file down. Asked pt to go ahead and schedule a 30 minute appointment for the procedure you recommended. Would you like pt to have any antibiotics or pain medications until he gets in for his appointment? Please advise.

## 2017-04-08 NOTE — Telephone Encounter (Signed)
Pt.notified

## 2017-04-15 ENCOUNTER — Ambulatory Visit (INDEPENDENT_AMBULATORY_CARE_PROVIDER_SITE_OTHER): Payer: BLUE CROSS/BLUE SHIELD | Admitting: Sports Medicine

## 2017-04-15 ENCOUNTER — Encounter: Payer: Self-pay | Admitting: Sports Medicine

## 2017-04-15 DIAGNOSIS — L84 Corns and callosities: Secondary | ICD-10-CM | POA: Diagnosis not present

## 2017-04-15 MED ORDER — HYDROCODONE-ACETAMINOPHEN 5-325 MG PO TABS: 1.0000 | ORAL_TABLET | Freq: Three times a day (TID) | ORAL | 0 refills | Status: DC | PRN

## 2017-04-15 NOTE — Progress Notes (Signed)
  Subjective:    CC: Lesion on foot  HPI: For months now this pleasant 61 year old male has had a lesion on the lateral aspect of his right great toe, causing moderate to severe pain during ambulation. We tried some unloading, using Band-Aids, unfortunately he continues to pain, moderate, persistent, localized without radiation, desires excision.  Past medical history:  Negative.  See flowsheet/record as well for more information.  Surgical history: Negative.  See flowsheet/record as well for more information.  Family history: Negative.  See flowsheet/record as well for more information.  Social history: Negative.  See flowsheet/record as well for more information.  Allergies, and medications have been entered into the medical record, reviewed, and no changes needed.   Review of Systems: No fevers, chills, night sweats, weight loss, chest pain, or shortness of breath.   Objective:    General: Well Developed, well nourished, and in no acute distress.  Neuro: Alert and oriented x3, extra-ocular muscles intact, sensation grossly intact.  HEENT: Normocephalic, atraumatic, pupils equal round reactive to light, neck supple, no masses, no lymphadenopathy, thyroid nonpalpable.  Skin: Warm and dry, no rashes. Cardiac: Regular rate and rhythm, no murmurs rubs or gallops, no lower extremity edema.  Respiratory: Clear to auscultation bilaterally. Not using accessory muscles, speaking in full sentences. Right great toe: Large callus/corn on the lateral aspect of the great toe.  Procedure:  Excision of right great toe 1.1 cm corn Risks, benefits, and alternatives explained and consent obtained. Time out conducted. Surface prepped with alcohol. 2cc lidocaine with epinephine infiltrated in a field block. Adequate anesthesia ensured. Area prepped and draped in a sterile fashion. Excision performed with: Using a #15 blade and made an elliptical incision around the lesion and carried down to the  subcutaneous tissues, then using blunt dissection undermined the edges of the wound staying away from the neurovascular bundle, I then placed 3, 3-0 proline sutures to approximate the edges of the wound. Hemostasis achieved. Pt stable.  Impression and Recommendations:    Callus of right great toe I performed a surgical excision with primary closure of what appears to be a large corn on the lateral aspect of the right great toe. He will return in one week for wound check, and in 2 weeks for suture removal. Advised him not to smoke while this wound is healing. Small amount of hydrocodone given for pain post-procedurally.  I spent 25 minutes with this patient, greater than 50% was face-to-face time counseling regarding the above diagnoses, this was separate from the time spent performing the procedure.

## 2017-04-15 NOTE — Assessment & Plan Note (Signed)
I performed a surgical excision with primary closure of what appears to be a large corn on the lateral aspect of the right great toe. He will return in one week for wound check, and in 2 weeks for suture removal. Advised him not to smoke while this wound is healing. Small amount of hydrocodone given for pain post-procedurally.

## 2017-04-22 ENCOUNTER — Ambulatory Visit: Payer: BLUE CROSS/BLUE SHIELD | Admitting: Sports Medicine

## 2017-04-29 ENCOUNTER — Encounter: Payer: Self-pay | Admitting: Sports Medicine

## 2017-04-29 ENCOUNTER — Ambulatory Visit (INDEPENDENT_AMBULATORY_CARE_PROVIDER_SITE_OTHER): Payer: BLUE CROSS/BLUE SHIELD | Admitting: Sports Medicine

## 2017-04-29 DIAGNOSIS — L84 Corns and callosities: Secondary | ICD-10-CM

## 2017-04-29 NOTE — Progress Notes (Signed)
  Subjective: Patient is a 2 weeks post excision of a large corn on the lateral aspect of his right great toe, doing extremely well. His toe feels significant better, he just has some discomfort because of the sutures.  Objective: General: Well-developed, well-nourished, and in no acute distress. Right great toe: Incision is clean, dry, intact, sutures were in place and removed today.  Assessment/plan:   Callus of right great toe Doing extremely well 2 weeks post surgical excision of a large callus/corn on his great toe. Sutures are removed today, he should massage the scar to soften it. Return as needed.  ___________________________________________ Gwen Her. Dianah Field, M.D., ABFM., CAQSM. Primary Care and [place] Instructor of [place] [place] [facility]

## 2017-04-29 NOTE — Assessment & Plan Note (Signed)
Doing extremely well 2 weeks post surgical excision of a large callus/corn on his great toe. Sutures are removed today, he should massage the scar to soften it. Return as needed.

## 2017-10-05 ENCOUNTER — Ambulatory Visit (INDEPENDENT_AMBULATORY_CARE_PROVIDER_SITE_OTHER): Payer: BLUE CROSS/BLUE SHIELD

## 2017-10-05 ENCOUNTER — Ambulatory Visit: Payer: BLUE CROSS/BLUE SHIELD | Admitting: Sports Medicine

## 2017-10-05 DIAGNOSIS — R69 Illness, unspecified: Secondary | ICD-10-CM

## 2017-10-05 DIAGNOSIS — F17200 Nicotine dependence, unspecified, uncomplicated: Secondary | ICD-10-CM | POA: Diagnosis not present

## 2017-10-05 DIAGNOSIS — R05 Cough: Secondary | ICD-10-CM | POA: Diagnosis not present

## 2017-10-05 DIAGNOSIS — J111 Influenza due to unidentified influenza virus with other respiratory manifestations: Secondary | ICD-10-CM | POA: Insufficient documentation

## 2017-10-05 MED ORDER — OSELTAMIVIR PHOSPHATE 75 MG PO CAPS: 75.0000 mg | ORAL_CAPSULE | Freq: Two times a day (BID) | ORAL | 0 refills | Status: DC

## 2017-10-05 NOTE — Assessment & Plan Note (Addendum)
Patient declined parenteral hydration today in the office. Aggressive salt-containing fluid hydration. Is low but outside of the window but considering his severe presentation we are going to add Tamiflu. Chest x-ray. Return to see me on Friday. Call much sooner if symptoms worsen.

## 2017-10-05 NOTE — Progress Notes (Signed)
  Subjective:    CC: Feeling sick  HPI: For the past several days Jason Page has had fevers, chills, muscle aches, body aches, sore throat, cough.  Severe fatigue.  Wife diagnosed with influenza.  Symptoms are severe, persistent.  I reviewed the past medical history, family history, social history, surgical history, and allergies today and no changes were needed.  Please see the problem list section below in epic for further details.  Past Medical History: Past Medical History:  Diagnosis Date  . Hypertension   . Lumbar herniated disc    Past Surgical History: No past surgical history on file. Social History: Social History   Socioeconomic History  . Marital status: Married    Spouse name: Not on file  . Number of children: Not on file  . Years of education: Not on file  . Highest education level: Not on file  Social Needs  . Financial resource strain: Not on file  . Food insecurity - worry: Not on file  . Food insecurity - inability: Not on file  . Transportation needs - medical: Not on file  . Transportation needs - non-medical: Not on file  Occupational History  . Not on file  Tobacco Use  . Smoking status: Current Every Day Smoker    Packs/day: 1.50    Types: Cigarettes  . Smokeless tobacco: Never Used  Substance and Sexual Activity  . Alcohol use: Yes  . Drug use: No  . Sexual activity: Not on file  Other Topics Concern  . Not on file  Social History Narrative  . Not on file   Family History: Family History  Problem Relation Age of Onset  . Heart attack Father    Allergies: No Known Allergies Medications: See med rec.  Review of Systems: No fevers, chills, night sweats, weight loss, chest pain, or shortness of breath.   Objective:    General: Well Developed, well nourished, and appears ill.  Neuro: Alert and oriented x3, extra-ocular muscles intact, sensation grossly intact.  HEENT: Normocephalic, atraumatic, pupils equal round reactive to light, neck  supple, no masses, no lymphadenopathy, thyroid nonpalpable.  Oropharynx, nasopharynx, ear canals unremarkable. Skin: Warm and dry, no rashes. Cardiac: Regular rate and rhythm, no murmurs rubs or gallops, no lower extremity edema.  Respiratory: Coarse sounds throughout. Not using accessory muscles, speaking in full sentences.  Impression and Recommendations:    Influenza-like illness Patient declined parenteral hydration today in the office. Aggressive salt-containing fluid hydration. Is low but outside of the window but considering his severe presentation we are going to add Tamiflu. Chest x-ray. Return to see me on Friday. Call much sooner if symptoms worsen. ___________________________________________ Gwen Her. Dianah Field, M.D., ABFM., CAQSM. Primary Care and [place] Instructor of [place] [facility]

## 2017-10-08 ENCOUNTER — Encounter: Payer: Self-pay | Admitting: Sports Medicine

## 2017-10-08 ENCOUNTER — Ambulatory Visit: Payer: BLUE CROSS/BLUE SHIELD | Admitting: Sports Medicine

## 2017-10-08 DIAGNOSIS — J111 Influenza due to unidentified influenza virus with other respiratory manifestations: Secondary | ICD-10-CM

## 2017-10-08 DIAGNOSIS — R69 Illness, unspecified: Secondary | ICD-10-CM | POA: Diagnosis not present

## 2017-10-08 NOTE — Progress Notes (Signed)
  Subjective:    CC: Follow-up  HPI: This is a pleasant 62 year old male, I saw him earlier this week with influenza-like illness, we gave him Tamiflu, he was very weak, shaky, felt significantly fatigued.  He declined IV fluids at that time.  He returns today really not feeling much better, mostly fatigue, muscle aches, not a whole lot of cough, shortness of breath.  No more fevers.  Symptoms are moderate, persistent.  I reviewed the past medical history, family history, social history, surgical history, and allergies today and no changes were needed.  Please see the problem list section below in epic for further details.  Past Medical History: Past Medical History:  Diagnosis Date  . Hypertension   . Lumbar herniated disc    Past Surgical History: No past surgical history on file. Social History: Social History   Socioeconomic History  . Marital status: Married    Spouse name: None  . Number of children: None  . Years of education: None  . Highest education level: None  Social Needs  . Financial resource strain: None  . Food insecurity - worry: None  . Food insecurity - inability: None  . Transportation needs - medical: None  . Transportation needs - non-medical: None  Occupational History  . None  Tobacco Use  . Smoking status: Current Every Day Smoker    Packs/day: 1.50    Types: Cigarettes  . Smokeless tobacco: Never Used  Substance and Sexual Activity  . Alcohol use: Yes  . Drug use: No  . Sexual activity: None  Other Topics Concern  . None  Social History Narrative  . None   Family History: Family History  Problem Relation Age of Onset  . Heart attack Father    Allergies: No Known Allergies Medications: See med rec.  Review of Systems: No fevers, chills, night sweats, weight loss, chest pain, or shortness of breath.   Objective:    General: Well Developed, well nourished, and in no acute distress.  Neuro: Alert and oriented x3, extra-ocular muscles  intact, sensation grossly intact.  HEENT: Normocephalic, atraumatic, pupils equal round reactive to light, neck supple, no masses, no lymphadenopathy, thyroid nonpalpable.  Oropharynx, nasopharynx and ear canals unremarkable. Skin: Warm and dry, no rashes. Cardiac: Regular rate and rhythm, no murmurs rubs or gallops, no lower extremity edema.  Respiratory: Clear to auscultation bilaterally. Not using accessory muscles, speaking in full sentences.  20-gauge angiocatheter placed in the right cephalic vein at the level of the elbow, I then infused 2 L of normal saline.  Impression and Recommendations:    Influenza-like illness Still feeling sick, currently on Tamiflu. 2 L normal saline infused today. Principal symptom is severe weakness. I am going to check some labs including a CK.  I spent 40 minutes with this patient, greater than 50% was face-to-face time counseling regarding the above diagnoses ___________________________________________ Gwen Her. Dianah Field, M.D., ABFM., CAQSM. Primary Care and [place] Instructor of [place] [facility]

## 2017-10-08 NOTE — Assessment & Plan Note (Signed)
Still feeling sick, currently on Tamiflu. 2 L normal saline infused today. Principal symptom is severe weakness. I am going to check some labs including a CK.

## 2017-12-06 DIAGNOSIS — H00014 Hordeolum externum left upper eyelid: Secondary | ICD-10-CM | POA: Diagnosis not present

## 2018-03-04 ENCOUNTER — Encounter: Payer: Self-pay | Admitting: Family Medicine

## 2018-03-04 ENCOUNTER — Ambulatory Visit (INDEPENDENT_AMBULATORY_CARE_PROVIDER_SITE_OTHER): Payer: BLUE CROSS/BLUE SHIELD | Admitting: Family Medicine

## 2018-03-04 VITALS — BP 159/97 | HR 80 | Ht 65.0 in | Wt 118.0 lb

## 2018-03-04 DIAGNOSIS — R9431 Abnormal electrocardiogram [ECG] [EKG]: Secondary | ICD-10-CM

## 2018-03-04 DIAGNOSIS — J441 Chronic obstructive pulmonary disease with (acute) exacerbation: Secondary | ICD-10-CM

## 2018-03-04 DIAGNOSIS — F17200 Nicotine dependence, unspecified, uncomplicated: Secondary | ICD-10-CM | POA: Diagnosis not present

## 2018-03-04 DIAGNOSIS — R0602 Shortness of breath: Secondary | ICD-10-CM | POA: Diagnosis not present

## 2018-03-04 MED ORDER — UMECLIDINIUM-VILANTEROL 62.5-25 MCG/INH IN AEPB: 1.0000 | INHALATION_SPRAY | Freq: Every day | RESPIRATORY_TRACT | 5 refills | Status: DC

## 2018-03-04 MED ORDER — IPRATROPIUM-ALBUTEROL 0.5-2.5 (3) MG/3ML IN SOLN
3.0000 mL | Freq: Once | RESPIRATORY_TRACT | Status: AC
Start: 2018-03-04 — End: 2018-03-04
  Administered 2018-03-04: 3 mL via RESPIRATORY_TRACT

## 2018-03-04 MED ORDER — ALBUTEROL SULFATE (2.5 MG/3ML) 0.083% IN NEBU
2.5000 mg | INHALATION_SOLUTION | Freq: Once | RESPIRATORY_TRACT | Status: AC
Start: 2018-03-04 — End: 2018-03-04
  Administered 2018-03-04: 2.5 mg via RESPIRATORY_TRACT

## 2018-03-04 MED ORDER — PREDNISONE 20 MG PO TABS: 40.0000 mg | ORAL_TABLET | Freq: Every day | ORAL | 0 refills | Status: DC

## 2018-03-04 MED ORDER — ALBUTEROL SULFATE HFA 108 (90 BASE) MCG/ACT IN AERS
2.0000 | INHALATION_SPRAY | Freq: Four times a day (QID) | RESPIRATORY_TRACT | 0 refills | Status: DC | PRN
Start: 2018-03-04 — End: 2018-12-16

## 2018-03-04 MED ORDER — DOXYCYCLINE HYCLATE 100 MG PO TABS: 100.0000 mg | ORAL_TABLET | Freq: Two times a day (BID) | ORAL | 0 refills | Status: DC

## 2018-03-04 MED ORDER — METHYLPREDNISOLONE SODIUM SUCC 125 MG IJ SOLR
125.0000 mg | Freq: Once | INTRAMUSCULAR | Status: AC
  Administered 2018-03-04: 125 mg via INTRAMUSCULAR

## 2018-03-04 NOTE — Patient Instructions (Signed)
Please quit smoking 

## 2018-03-04 NOTE — Progress Notes (Signed)
Subjective:    Patient ID: Jason Page, male    DOB: 1955-12-01, 62 y.o.   MRN: [id number]  HPI 62 year old male smoker with COPD comes in today complaining of increased shortness of breath.  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  He is been very short of breath and feels like he has had difficulty doing things like climbing stairs.  In fact yesterday going up stairs he felt like he was going to actually pass out.  More recently he actually went to the fire department and they gave him oxygen because he felt so short of breath.  It has been in the mid 90s here locally with very poor air quality.  He is not on any prophylactic inhalers and continues to smoke daily. He has lost a couple of pounds.      Review of Systems  BP (!) 159/97   Pulse 80   Ht 5\' 5"  (1.651 m)   Wt 118 lb (53.5 kg)   SpO2 100%   PF 130 L/min   BMI 19.64 kg/m     No Known Allergies  Past Medical History:  Diagnosis Date  . Hypertension   . Lumbar herniated disc     No past surgical history on file.  Social History   Socioeconomic History  . Marital status: Married    Spouse name: Not on file  . Number of children: Not on file  . Years of education: Not on file  . Highest education level: Not on file  Occupational History  . Not on file  Social Needs  . Financial resource strain: Not on file  . Food insecurity:    Worry: Not on file    Inability: Not on file  . Transportation needs:    Medical: Not on file    Non-medical: Not on file  Tobacco Use  . Smoking status: Current Every Day Smoker    Packs/day: 1.50    Types: Cigarettes  . Smokeless tobacco: Never Used  Substance and Sexual Activity  . Alcohol use: Yes  . Drug use: No  . Sexual activity: Not on file  Lifestyle  . Physical activity:    Days per week: Not on file    Minutes per session: Not on file  . Stress: Not on file  Relationships  . Social connections:    Talks on phone: Not on file    Gets together: Not  on file    Attends religious service: Not on file    Active member of club or organization: Not on file    Attends meetings of clubs or organizations: Not on file    Relationship status: Not on file  . Intimate partner violence:    Fear of current or ex partner: Not on file    Emotionally abused: Not on file    Physically abused: Not on file    Forced sexual activity: Not on file  Other Topics Concern  . Not on file  Social History Narrative  . Not on file      Family History  Problem Relation Age of Onset  . Heart attack Father     Outpatient Encounter Medications as of 03/04/2018  Medication Sig  . albuterol (PROVENTIL HFA;VENTOLIN HFA) 108 (90 Base) MCG/ACT inhaler Inhale 2 puffs into the lungs every 6 (six) hours as needed for wheezing or shortness of breath. Generic pleaes  . doxycycline (VIBRA-TABS) 100 MG tablet Take 1 tablet (100 mg total) by mouth 2 (two)  times daily.  . predniSONE (DELTASONE) 20 MG tablet Take 2 tablets (40 mg total) by mouth daily with breakfast.  . umeclidinium-vilanterol (ANORO ELLIPTA) 62.5-25 MCG/INH AEPB Inhale 1 puff into the lungs daily.  . [DISCONTINUED] albuterol (PROVENTIL HFA;VENTOLIN HFA) 108 (90 Base) MCG/ACT inhaler Inhale 2 puffs into the lungs every 6 (six) hours as needed for wheezing or shortness of breath.  . [DISCONTINUED] azelastine (OPTIVAR) 0.05 % ophthalmic solution Place 1 drop into both eyes 2 (two) times daily.  . [DISCONTINUED] oseltamivir (TAMIFLU) 75 MG capsule Take 1 capsule (75 mg total) by mouth 2 (two) times daily.  . [EXPIRED] albuterol (PROVENTIL) (2.5 MG/3ML) 0.083% nebulizer solution 2.5 mg   . [EXPIRED] ipratropium-albuterol (DUONEB) 0.5-2.5 (3) MG/3ML nebulizer solution 3 mL   . [EXPIRED] methylPREDNISolone sodium succinate (SOLU-MEDROL) 125 mg/2 mL injection 125 mg    No facility-administered encounter medications on file as of 03/04/2018.          Objective:   Physical Exam  Constitutional: He is oriented  to person, place, and time. He appears well-developed and well-nourished.  HENT:  Head: Normocephalic and atraumatic.  Right Ear: External ear normal.  Left Ear: External ear normal.  Nose: Nose normal.  Mouth/Throat: Oropharynx is clear and moist.  Left TM and canals clear.  Right TM is blocked by cerumen.  Eyes: Pupils are equal, round, and reactive to light. Conjunctivae and EOM are normal.  Neck: Neck supple. No thyromegaly present.  Cardiovascular: Normal rate and normal heart sounds.  Pulmonary/Chest: Effort normal.  Diffuse decreased breath sounds but no wheezing or rhonchi.  Lymphadenopathy:    He has no cervical adenopathy.  Neurological: He is alert and oriented to person, place, and time.  Skin: Skin is warm and dry.  Psychiatric: He has a normal mood and affect.       Assessment & Plan:  COPD exacerbation.  Peak flow in the red zone.  Though I do not know what his baseline breathing is.  He really needs a spirometry.  Given Solu-Medrol 120 mg IM here in the office and 2 albuterol nebulizer treatments.  He did feel some better after the breathing treatments.  If he is not improving or gets worse and he is to go to the emergency department immediately.  Will treat with prednisone and doxycycline.  I also encouraged him to take a week off of work.  He does have some time that he could take from work to do that.  Work note provided.  Chest x-ray was in February.  Tob abuse -did encourage him to quit smoking.  Abnormal EKG - will order ECHO and refer to Cardiology for further workup.  He is extremely short of breath with normal chest sounds and even with 6-minute walk test only dropped to 98% even though he felt like he could not breathe at all.  This is concerning for possible cardiac issue and I think we should work this up further.  Will place referral to cardiology as well as go ahead and get an echocardiogram.  EKG shows rate of 87 bpm, normal sinus rhythm.  Possible left  atrial enlargement otherwise normal.

## 2018-03-08 NOTE — Addendum Note (Signed)
Addended by: Clemetine Marker A on: 03/08/2018 02:45 PM   Modules accepted: Orders

## 2018-03-21 ENCOUNTER — Ambulatory Visit: Payer: BLUE CROSS/BLUE SHIELD | Admitting: Cardiology

## 2018-12-12 ENCOUNTER — Telehealth: Payer: Self-pay

## 2018-12-12 DIAGNOSIS — F17200 Nicotine dependence, unspecified, uncomplicated: Secondary | ICD-10-CM | POA: Diagnosis not present

## 2018-12-12 DIAGNOSIS — R030 Elevated blood-pressure reading, without diagnosis of hypertension: Secondary | ICD-10-CM | POA: Diagnosis not present

## 2018-12-12 DIAGNOSIS — I10 Essential (primary) hypertension: Secondary | ICD-10-CM | POA: Diagnosis not present

## 2018-12-12 DIAGNOSIS — R0609 Other forms of dyspnea: Secondary | ICD-10-CM | POA: Diagnosis not present

## 2018-12-12 DIAGNOSIS — J449 Chronic obstructive pulmonary disease, unspecified: Secondary | ICD-10-CM | POA: Diagnosis not present

## 2018-12-12 DIAGNOSIS — R0602 Shortness of breath: Secondary | ICD-10-CM | POA: Diagnosis not present

## 2018-12-12 LAB — CBC AND DIFFERENTIAL
HCT: 38 — AB (ref 41–53)
Hemoglobin: 13.6 (ref 13.5–17.5)
WBC: 8.3

## 2018-12-12 LAB — HEPATIC FUNCTION PANEL: Bilirubin, Total: 0.8

## 2018-12-12 LAB — BASIC METABOLIC PANEL WITH GFR
BUN: 10 (ref 4–21)
Creatinine: 0.7 (ref 0.6–1.3)
Glucose: 84
Potassium: 4.7 (ref 3.4–5.3)
Sodium: 128 — AB (ref 137–147)

## 2018-12-12 NOTE — Telephone Encounter (Signed)
Yes, thank you.  He needs to go to the ED as soon as possible.

## 2018-12-12 NOTE — Telephone Encounter (Signed)
Jason Page called and complains of weakness and shortness of breath. He has felt bad all weekend. If he take a couple of steps he is out of breath. He states he stayed in bed all weekend wrapped up because he was cold. He didn't know if he had a fever. He checked his blood pressure and it was 196/96. I advised him to go to the ED today.

## 2018-12-13 ENCOUNTER — Telehealth: Payer: Self-pay

## 2018-12-13 NOTE — Telephone Encounter (Signed)
Jason Page is here in the office. He went to the ED yesterday. He was prescribed Amlodipine. He was advised not to start the Amlodipine until approved by PCP.

## 2018-12-16 ENCOUNTER — Ambulatory Visit (INDEPENDENT_AMBULATORY_CARE_PROVIDER_SITE_OTHER): Payer: BLUE CROSS/BLUE SHIELD | Admitting: Family Medicine

## 2018-12-16 ENCOUNTER — Encounter: Payer: Self-pay | Admitting: Family Medicine

## 2018-12-16 VITALS — BP 172/93 | HR 91 | Temp 98.3°F | Ht 65.0 in | Wt 117.0 lb

## 2018-12-16 DIAGNOSIS — Z125 Encounter for screening for malignant neoplasm of prostate: Secondary | ICD-10-CM | POA: Diagnosis not present

## 2018-12-16 DIAGNOSIS — R748 Abnormal levels of other serum enzymes: Secondary | ICD-10-CM

## 2018-12-16 DIAGNOSIS — Z13220 Encounter for screening for lipoid disorders: Secondary | ICD-10-CM

## 2018-12-16 DIAGNOSIS — E871 Hypo-osmolality and hyponatremia: Secondary | ICD-10-CM | POA: Diagnosis not present

## 2018-12-16 DIAGNOSIS — I10 Essential (primary) hypertension: Secondary | ICD-10-CM

## 2018-12-16 DIAGNOSIS — J441 Chronic obstructive pulmonary disease with (acute) exacerbation: Secondary | ICD-10-CM

## 2018-12-16 MED ORDER — AMLODIPINE BESYLATE 5 MG PO TABS: 5.0000 mg | ORAL_TABLET | Freq: Every day | ORAL | 3 refills | Status: DC

## 2018-12-16 MED ORDER — UMECLIDINIUM BROMIDE 62.5 MCG/INH IN AEPB: 1.0000 | INHALATION_SPRAY | RESPIRATORY_TRACT | 6 refills | Status: DC

## 2018-12-16 NOTE — Progress Notes (Signed)
Established Patient Office Visit  Subjective:  Patient ID: Jason Page, male    DOB: 16-Dec-1955  Age: 63 y.o. MRN: [id number]  CC:  Chief Complaint  Patient presents with  . Hospitalization Follow-up    BP, COPD pt reports that the ED doctor had perscribed Amlodipine 5 mg due to his BP being elevated (186/82)  but was told that he the pt would need to get Dr. Gardiner Ramus blessing before he starts taking this medication    HPI [name] presents for hospital follow-up for COPD exacerbation.  Went to [facility] on April 27 for shortness of breath.  His blood pressure was quite elevated when he arrived at 186/82.  Hemoglobin was normal at 13.  Sodium and chloride were both low.  Sodium was low at 128.  1 of the liver enzymes, the AST was just mildly elevated.  D-dimer was also mildly elevated at 0.59 with a cutoff of 0.5.  BN P was normal.  PT/INR as well as troponin levels were also normal.  He did have some protein levels on urine dipstick.  He actually left AMA per the ED report.  Portable chest x-ray in the ED was also negative.  His blood pressure was high and so he was given a prescription for amlodipine 5 mg.  He was told not to fill it until he followed up.  He says he is made some major lifestyle changes.  He is cut out alcohol and has quit smoking.  Past Medical History:  Diagnosis Date  . Hypertension   . Lumbar herniated disc     No past surgical history on file.  Family History  Problem Relation Age of Onset  . Heart attack Father     Social History   Socioeconomic History  . Marital status: Married    Spouse name: Not on file  . Number of children: Not on file  . Years of education: Not on file  . Highest education level: Not on file  Occupational History  . Not on file  Social Needs  . Financial resource strain: Not on file  . Food insecurity:    Worry: Not on file    Inability: Not on file  . Transportation needs:    Medical: Not on file     Non-medical: Not on file  Tobacco Use  . Smoking status: Current Every Day Smoker    Packs/day: 1.50    Types: Cigarettes  . Smokeless tobacco: Never Used  Substance and Sexual Activity  . Alcohol use: Yes  . Drug use: No  . Sexual activity: Not on file  Lifestyle  . Physical activity:    Days per week: Not on file    Minutes per session: Not on file  . Stress: Not on file  Relationships  . Social connections:    Talks on phone: Not on file    Gets together: Not on file    Attends religious service: Not on file    Active member of club or organization: Not on file    Attends meetings of clubs or organizations: Not on file    Relationship status: Not on file  . Intimate partner violence:    Fear of current or ex partner: Not on file    Emotionally abused: Not on file    Physically abused: Not on file    Forced sexual activity: Not on file  Other Topics Concern  . Not on file  Social History Narrative  .  Not on file    Outpatient Medications Prior to Visit  Medication Sig Dispense Refill  . albuterol (PROVENTIL HFA;VENTOLIN HFA) 108 (90 Base) MCG/ACT inhaler Inhale 2 puffs into the lungs every 6 (six) hours as needed for wheezing or shortness of breath. Generic pleaes 1 Inhaler 0  . umeclidinium-vilanterol (ANORO ELLIPTA) 62.5-25 MCG/INH AEPB Inhale 1 puff into the lungs daily. 60 each 5   No facility-administered medications prior to visit.     No Known Allergies  ROS Review of Systems    Objective:    Physical Exam  Constitutional: He is oriented to person, place, and time. He appears well-developed and well-nourished.  Extremely thin male.  HENT:  Head: Normocephalic and atraumatic.  Right Ear: External ear normal.  Left Ear: External ear normal.  Eyes: Conjunctivae are normal.  Cardiovascular: Normal rate, regular rhythm and normal heart sounds.  Pulmonary/Chest: Effort normal and breath sounds normal.  Diffuse coarse breath sounds.  No wheezing.   Neurological: He is alert and oriented to person, place, and time.  Skin: Skin is warm and dry.  Psychiatric: He has a normal mood and affect. His behavior is normal.    BP (!) 172/93   Pulse 91   Temp 98.3 F (36.8 C)   Ht 5\' 5"  (1.651 m)   Wt 117 lb (53.1 kg)   SpO2 100%   BMI 19.47 kg/m  Wt Readings from Last 3 Encounters:  12/16/18 117 lb (53.1 kg)  03/04/18 118 lb (53.5 kg)  10/08/17 120 lb (54.4 kg)     Health Maintenance Due  Topic Date Due  . Hepatitis C Screening  [birth year]  . HIV Screening  04/09/1971  . COLONOSCOPY  08/17/2016    There are no preventive care reminders to display for this patient.  No results found for: TSH Lab Results  Component Value Date   WBC 10.0 09/07/2016   HGB 15.3 09/07/2016   HCT 44.0 09/07/2016   MCV 100.0 09/07/2016   PLT 293 09/07/2016   Lab Results  Component Value Date   NA 132 (L) 09/07/2016   K 5.1 09/07/2016   CO2 27 09/07/2016   GLUCOSE 80 09/07/2016   BUN 14 09/07/2016   CREATININE 0.82 09/07/2016   BILITOT 1.1 09/07/2016   ALKPHOS 61 09/07/2016   AST 69 (H) 09/07/2016   ALT 62 (H) 09/07/2016   PROT 8.1 09/07/2016   ALBUMIN 4.6 09/07/2016   CALCIUM 10.2 09/07/2016   Lab Results  Component Value Date   CHOL 198 10/13/2011   Lab Results  Component Value Date   HDL 57 10/13/2011   Lab Results  Component Value Date   LDLCALC 110 10/13/2011   Lab Results  Component Value Date   TRIG 155 10/13/2011   No results found for: CHOLHDL No results found for: HGBA1C    Assessment & Plan:   Problem List Items Addressed This Visit      Respiratory   COPD exacerbation ([place]) - Primary   Relevant Medications   umeclidinium bromide (INCRUSE ELLIPTA) 62.5 MCG/INH AEPB   Other Relevant Orders   COMPLETE METABOLIC PANEL WITH GFR   Lipid Panel w/reflex Direct LDL   PSA    Other Visit Diagnoses    Hyponatremia       Relevant Orders   COMPLETE METABOLIC PANEL WITH GFR   Lipid Panel w/reflex Direct LDL    Elevated liver enzymes       Relevant Orders   COMPLETE METABOLIC PANEL WITH GFR  Lipid Panel w/reflex Direct LDL   Screening for prostate cancer       Relevant Orders   PSA   Screening for lipoid disorders       Relevant Orders   Lipid Panel w/reflex Direct LDL    COPD exacerbation-overall he is doing much better he feels like he is back to his baseline.  He has not been taking the Anoro because of cost.  It looks like Incruse is covered tier 1.  It does not have the long-acting albuterol but if it is covered and it is affordable and he can use it consistently then I think that is what we should go with.  New prescription sent to pharmacy.  Hypertension-uncontrolled.  We will go ahead and start amlodipine.  New prescription sent to pharmacy under my name.  I like to see him back in 1 month so we can recheck that pressure and make adjustments I explained that most blood pressure pills were only lower blood pressure by about 5-10 points which is probably not going to be enough to get his blood pressure under control but certainly should improve and especially if he quit smoking and drinking alcohol.    Hyponatremia-need to recheck levels to see if they are back to normal.  Given suspect probably alcohol related.  Will recheck again in a couple of weeks.  He is asymptomatic.  Mild elevation in the AST.  Recommend avoid all alcohol in excess Tylenol.  Suspect alcohol related but he says he has quit drinking so our plan will be to recheck the levels in about 2 to 3 weeks.  Due to recheck PSA for prostate cancer screening.   Meds ordered this encounter  Medications  . amLODipine (NORVASC) 5 MG tablet    Sig: Take 1 tablet (5 mg total) by mouth daily.    Dispense:  30 tablet    Refill:  3  . umeclidinium bromide (INCRUSE ELLIPTA) 62.5 MCG/INH AEPB    Sig: Inhale 1 puff into the lungs every morning.    Dispense:  1 each    Refill:  6    Follow-up: Return in about 4 weeks (around  01/13/2019) for blood pressure.    Beatrice Lecher, MD

## 2019-01-17 ENCOUNTER — Encounter: Payer: Self-pay | Admitting: Family Medicine

## 2019-01-17 LAB — LAB REPORT - SCANNED
Alkaline Phosphatase: 63
B Natriuretic Peptide: 107
Calcium: 9.9
Chloride: 87
D-dimer: 0.59
MCH: 34.6
MCV: 98 (ref 76–111)
Magnesium: 1.8
PTT: 29 (ref 25.8–37.1)
Potassium: 4.7
RBC: 3.93 (ref 3.87–5.11)

## 2019-04-07 ENCOUNTER — Other Ambulatory Visit: Payer: Self-pay

## 2019-04-07 ENCOUNTER — Ambulatory Visit (INDEPENDENT_AMBULATORY_CARE_PROVIDER_SITE_OTHER): Payer: BC Managed Care – PPO | Admitting: Sports Medicine

## 2019-04-07 ENCOUNTER — Encounter: Payer: Self-pay | Admitting: Sports Medicine

## 2019-04-07 DIAGNOSIS — C44310 Basal cell carcinoma of skin of unspecified parts of face: Secondary | ICD-10-CM

## 2019-04-07 NOTE — Assessment & Plan Note (Signed)
Raised, pearly, telangiectatic skin lesion below the left eye, I do think it is a large basal cell carcinoma, considering its location I do think we should have dermatology remove this for a better cosmetic result.

## 2019-04-07 NOTE — Progress Notes (Signed)
  Subjective:    CC: Skin lesion  HPI: For several months Jason Page has noted a spot under his left eye, it is been slowly growing.  It is nonpainful, it does not bleed.  I reviewed the past medical history, family history, social history, surgical history, and allergies today and no changes were needed.  Please see the problem list section below in epic for further details.  Past Medical History: Past Medical History:  Diagnosis Date  . Hypertension   . Lumbar herniated disc    Past Surgical History: No past surgical history on file. Social History: Social History   Socioeconomic History  . Marital status: Married    Spouse name: Not on file  . Number of children: Not on file  . Years of education: Not on file  . Highest education level: Not on file  Occupational History  . Not on file  Social Needs  . Financial resource strain: Not on file  . Food insecurity    Worry: Not on file    Inability: Not on file  . Transportation needs    Medical: Not on file    Non-medical: Not on file  Tobacco Use  . Smoking status: Current Every Day Smoker    Packs/day: 1.50    Types: Cigarettes  . Smokeless tobacco: Never Used  Substance and Sexual Activity  . Alcohol use: Yes  . Drug use: No  . Sexual activity: Not on file  Lifestyle  . Physical activity    Days per week: Not on file    Minutes per session: Not on file  . Stress: Not on file  Relationships  . Social Herbalist on phone: Not on file    Gets together: Not on file    Attends religious service: Not on file    Active member of club or organization: Not on file    Attends meetings of clubs or organizations: Not on file    Relationship status: Not on file  Other Topics Concern  . Not on file  Social History Narrative  . Not on file   Family History: Family History  Problem Relation Age of Onset  . Heart attack Father    Allergies: No Known Allergies Medications: See med rec.  Review of Systems:  No fevers, chills, night sweats, weight loss, chest pain, or shortness of breath.   Objective:    General: Well Developed, well nourished, and in no acute distress.  Neuro: Alert and oriented x3, extra-ocular muscles intact, sensation grossly intact.  HEENT: Normocephalic, atraumatic, pupils equal round reactive to light, neck supple, no masses, no lymphadenopathy, thyroid nonpalpable.  Skin: Warm and dry, no rashes. Cardiac: Regular rate and rhythm, no murmurs rubs or gallops, no lower extremity edema.  Respiratory: Clear to auscultation bilaterally. Not using accessory muscles, speaking in full sentences. Face:     Impression and Recommendations:    Basal cell carcinoma (BCC) of face Raised, pearly, telangiectatic skin lesion below the left eye, I do think it is a large basal cell carcinoma, considering its location I do think we should have dermatology remove this for a better cosmetic result.   ___________________________________________ Gwen Her. Dianah Field, M.D., ABFM., CAQSM. Primary Care and Sports Medicine [facility]  Adjunct Professor of [employer] Medicine

## 2019-04-07 NOTE — Patient Instructions (Signed)
Basal Cell Carcinoma Basal cell carcinoma is the most common form of skin cancer. It begins in the basal cells, which are at the bottom of the outer skin layer (epidermis). Basal cell carcinoma can almost always be cured. It rarely spreads to other areas of the body (metastasizes). It may come back at the same location (recur), but it can be treated again if this happens. Basal cell carcinoma occurs most often on parts of the body that are frequently exposed to the sun, such as:  Parts of the head, including the scalp or face.  Ears.  Neck.  Arms or legs.  Backs of the hands. What are the causes? This condition is usually caused by exposure to ultraviolet (UV) light. UV light may come from the sun or from tanning beds. Other causes include:  Exposure to a highly poisonous metal (arsenic).  Exposure to high-energy X-rays (radiation).  Exposure to toxic tars and oils.  Certain genetic conditions, such as a condition that makes a person sensitive to sunlight (xeroderma pigmentosum). What increases the risk? You are more likely to develop this condition if:  You are older than 63 years of age.  You have: ? Fair skin (light complexion). ? Blond or red hair. ? Blue, green, or gray eyes. ? Childhood freckling. ? Had sun exposure over long periods of time, especially during childhood. ? Had repeated sunburns. ? A weakened immune system. ? Been exposed to certain chemicals, such as tar, soot, and arsenic. ? Chronic inflammatory conditions. ? Chronic infections.  You use tanning beds. What are the signs or symptoms? The main symptom of this condition is a growth or lesion on the skin.  The shape and color of the growth or lesion may vary. The main types include: ? An open sore that may remain open for 3 weeks or longer. The sore may bleed or crust. This type of lesion can be an early sign of basal cell carcinoma. Basal cell carcinoma often shows up as a sore that does not heal. ?  A reddish area that may crust, itch, or cause discomfort. This may occur on areas that are exposed to the sun. These patches might be easier to feel than to see. ? A shiny or clear bump that is red, white, or pink. In people who have dark hair, the bump is often tan, black, or brown. These bumps can look like moles. ? A pink growth with a raised border. The growth will have a crusted and indented area in the center. Small blood vessels may appear on the surface of the growth as it gets bigger. ? A scar-like area that looks like shiny, stretched skin. The area may be white, yellow, or waxy. It often has irregular borders. This may be a sign of more aggressive basal cell carcinoma. How is this diagnosed? This condition may be diagnosed with:  A physical exam.  Removal of a tissue sample to be examined under a microscope (biopsy). How is this treated? Treatment for this condition involves removing the cancerous tissue. The method that is used for this depends on the type, size, location, and number of tumors. Possible treatments include:  Mohs surgery. In this procedure, the cancerous skin cells are removed layer by layer until all of the tumor has been removed.  Surgical removal (excision) of the tumor. This involves removing the entire tumor and a small amount of normal skin that surrounds it.  Cryosurgery. This involves freezing the tumor with liquid nitrogen.  Plastic surgery.  The tumor is removed, and healthy skin from another part of the body is used to cover the wound. This may be done for large tumors that are in areas where it is not possible to stretch the nearby skin to sew the edges of the wound together.  Radiation. This may be used for tumors on the face.  Photodynamic therapy. A chemical cream is applied to the skin, and light exposure is used to activate the chemical.  Electrodesiccation and curettage. This involves alternately scraping and burning the tumor while using an  electric current to control bleeding.  Chemical treatments, such as imiquimod cream and interferon injections. These may be used to remove superficial tumors with minimal scarring. Follow these instructions at home:  Avoid direct exposure to the sun.  Do self-exams as told by your health care provider. Look for new spots or changes in your skin.  Keep all follow-up visits as told by your health care provider. This is important. How is this prevented?   Avoid the sun when it is the strongest. This is usually between 10 a.m. and 4 p.m.  When you are out in the sun, use a sunscreen that has a sun protection factor (SPF) of at least 48.  Apply sunscreen at least 30 minutes before exposure to the sun.  Reapply sunscreen every 2-4 hours while you are outside. Also reapply it after swimming and after excessive sweating.  Always wear hats, protective clothing, and UV-blocking sunglasses when you are outdoors.  Do not use tanning beds. Contact a health care provider if you:  Notice any new spots or any changes in your skin.  Have had a basal cell carcinoma tumor removed, and you notice a new growth in the same location. Get help right away if you have a spot that:  Is sore and does not heal.  Bleeds easily with minor injury. Summary  Basal cell carcinoma is the most common form of skin cancer. It begins in the bottom of the outer skin layer (epidermis). Basal cell carcinoma can almost always be cured.  This condition is usually caused by exposure to ultraviolet (UV) light. It mostly affects the face, scalp, neck, ears, arms, legs, or backs of the hands.  The main symptom of this condition is a growth or lesion on the skin that can vary in shape and color.  You can prevent this cancer by avoiding direct exposure to the sun, applying sunscreen of at least 30 SPF, and wearing protective clothing.  Apply sunscreen 30 minutes before you go out into the sun, and reapply every 2-4 hours  while you are outside. This information is not intended to replace advice given to you by your health care provider. Make sure you discuss any questions you have with your health care provider. Document Released: 02/07/2003 Document Revised: 12/22/2017 Document Reviewed: 12/22/2017 Elsevier Patient Education  2020 [employer].

## 2019-04-19 DIAGNOSIS — C44319 Basal cell carcinoma of skin of other parts of face: Secondary | ICD-10-CM | POA: Diagnosis not present

## 2019-06-23 ENCOUNTER — Telehealth: Payer: Self-pay

## 2019-06-23 NOTE — Telephone Encounter (Signed)
Patient called stating that he was scratched by his wife's new cat and he thinks it is causing him to lose mobility of his hand. He feels that his hand and arm are becoming paralyzed. No Tdap on file for pt. Unsure of vaccine status of animal.   I advised patient if he feels that he is losing feeling, sensation, and motion of his arm and hand, he needs to go to ER. Patient wanted to know if Dr Madilyn Fireman could work him in, I advised him that is not something we could manage in the office and we do not have any openings. Patient then wanted to know if I felt this could wait until next week, I advised to get this taken care of today ASAP given his SX. Patient agreeable and will go to ER.   FYI to PCP

## 2019-06-23 NOTE — Telephone Encounter (Signed)
I agree he needs to go the emergency department.  A cat scratch or bite can get infected very very quickly and can even require IV antibiotics.

## 2019-06-26 NOTE — Telephone Encounter (Signed)
Patient called back because he did not go to the ER after being advised on Friday. After speaking to triage, he was advised yet again to go to the ER. His symptoms were still the same. Patient was agreeable and said he would go to the Urgent Care.

## 2019-06-27 ENCOUNTER — Emergency Department (INDEPENDENT_AMBULATORY_CARE_PROVIDER_SITE_OTHER)
Admission: EM | Admit: 2019-06-27 | Discharge: 2019-06-27 | Disposition: A | Discharge status: Home / Self Care | Payer: BC Managed Care – PPO | Source: Home / Self Care

## 2019-06-27 ENCOUNTER — Other Ambulatory Visit: Payer: Self-pay

## 2019-06-27 DIAGNOSIS — R278 Other lack of coordination: Secondary | ICD-10-CM

## 2019-06-27 NOTE — ED Triage Notes (Signed)
Pt was scratched and bitten by cat a couple of weeks ago, then scratched a couple days ago.  Now patient is having a difficult time closing hand.  Pt also feels like he a hard time closing his hand on the right.

## 2019-06-27 NOTE — Discharge Instructions (Addendum)

## 2019-06-27 NOTE — ED Provider Notes (Signed)
[facility]    CSN: [id number] Arrival date & time: 06/27/19  [id number]      History   Chief Complaint Chief Complaint  Patient presents with  . Arm Pain    HPI TYREC WRIDE is a 63 y.o. male with history of hypertension, COPD presenting for decreased dexterity of right hand/wrist.  Patient states he is able to wiggle his fingers when his wrist is flexed, though in neutral position is unable to.  Patient denies decreased sensation, trauma to the area, neck, shoulder, elbow, wrist injury or pain.  Patient does note being scratched and bitten by a neighbor's kitten a few weeks ago as well as 2 days ago.  States the cat is 60 weeks old, up-to-date on shots.  No concern for rabies at this time.   Past Medical History:  Diagnosis Date  . Hypertension   . Lumbar herniated disc     Patient Active Problem List   Diagnosis Date Noted  . Basal cell carcinoma (BCC) of face 04/07/2019  . Influenza-like illness 10/05/2017  . Callus of right great toe 10/30/2016  . COPD exacerbation ([facility]) 09/07/2016  . Stye 12/06/2015  . Bilateral conjunctivitis 12/06/2015  . Allergic conjunctivitis of both eyes 12/06/2015  . Essential hypertension, benign 09/20/2013  . Tobacco abuse 09/20/2013  . Right knee pain 09/12/2012  . PAIN IN JOINT, ANKLE/FOOT 01/18/2007  . TOBACCO DEPENDENCE 05/25/2006  . LOW BACK PAIN 05/25/2006    History reviewed. No pertinent surgical history.     Home Medications    Prior to Admission medications   Medication Sig Start Date End Date Taking? Authorizing Provider  amLODipine (NORVASC) 5 MG tablet Take 1 tablet (5 mg total) by mouth daily. 12/16/18   Hali Marry, MD  umeclidinium bromide (INCRUSE ELLIPTA) 62.5 MCG/INH AEPB Inhale 1 puff into the lungs every morning. 12/16/18   Hali Marry, MD    Family History Family History  Problem Relation Age of Onset  . Heart attack Father     Social History Social History   Tobacco Use  .  Smoking status: Current Every Day Smoker    Packs/day: 1.50    Types: Cigarettes  . Smokeless tobacco: Never Used  Substance Use Topics  . Alcohol use: Yes  . Drug use: No     Allergies   Patient has no known allergies.   Review of Systems Review of Systems  Constitutional: Negative for fatigue and fever.  Respiratory: Negative for cough and shortness of breath.   Cardiovascular: Negative for chest pain and palpitations.  Gastrointestinal: Negative for abdominal pain, diarrhea and vomiting.  Musculoskeletal:       Positive for decreased ROM  Skin: Positive for wound. Negative for rash.  Neurological: Negative for speech difficulty and headaches.  All other systems reviewed and are negative.    Physical Exam Triage Vital Signs ED Triage Vitals  Enc Vitals Group     BP 06/27/19 0942 (!) 184/92     Pulse Rate 06/27/19 0938 93     Resp 06/27/19 0938 20     Temp 06/27/19 0938 97.6 F (36.4 C)     Temp src --      SpO2 06/27/19 0938 98 %     Weight 06/27/19 0941 128 lb (58.1 kg)     Height 06/27/19 0941 5\' 5"  (1.651 m)     Head Circumference --      Peak Flow --      Pain Score 06/27/19 0941  0     Pain Loc --      Pain Edu? --      Excl. in [place]? --    No data found.  Updated Vital Signs BP (!) 184/92   Pulse 93   Temp 97.6 F (36.4 C)   Resp 20   Ht 5\' 5"  (1.651 m)   Wt 128 lb (58.1 kg)   SpO2 98%   BMI 21.30 kg/m   Visual Acuity Right Eye Distance:   Left Eye Distance:   Bilateral Distance:    Right Eye Near:   Left Eye Near:    Bilateral Near:     Physical Exam Constitutional:      General: He is not in acute distress. HENT:     Head: Normocephalic and atraumatic.     Mouth/Throat:     Mouth: Mucous membranes are moist.     Pharynx: Oropharynx is clear.  Eyes:     General: No scleral icterus.    Pupils: Pupils are equal, round, and reactive to light.  Cardiovascular:     Rate and Rhythm: Normal rate.     Pulses: Normal pulses.   Pulmonary:     Effort: Pulmonary effort is normal.  Musculoskeletal:     Right wrist: He exhibits decreased range of motion. He exhibits no tenderness, no bony tenderness, no crepitus and no deformity.     Comments: Decreased range of motion of digits 2-5 with the wrist in neutral position, though full active ROM when in 45 degree flexion.  Otherwise unremarkable exam.  Neurovascularly intact.  Skin:    Capillary Refill: Capillary refill takes less than 2 seconds.     Coloration: Skin is not jaundiced or pale.     Findings: No erythema.     Comments: Few, scattered, superficial, largely resolved punctate wounds.  No surrounding erythema, tenderness, fluctuance, warmth.  Neurological:     General: No focal deficit present.     Mental Status: He is alert and oriented to person, place, and time.     Cranial Nerves: No cranial nerve deficit.     Sensory: No sensory deficit.     Motor: No weakness.     Coordination: Coordination normal.     Gait: Gait normal.     Deep Tendon Reflexes: Reflexes normal.  Psychiatric:        Mood and Affect: Mood normal.        Behavior: Behavior normal.      UC Treatments / Results  Labs (all labs ordered are listed, but only abnormal results are displayed) Labs Reviewed - No data to display  EKG   Radiology No results found.  Procedures Procedures (including critical care time)  Medications Ordered in UC Medications - No data to display  Initial Impression / Assessment and Plan / UC Course  I have reviewed the triage vital signs and the nursing notes.  Pertinent labs & imaging results that were available during my care of the patient were reviewed by me and considered in my medical decision making (see chart for details).     Patient with decreased right digits 2 through 5 active ROM with wrist in neutral position, otherwise completely unremarkable exam -patient is without neurocognitive deficit.  No sign of infection where cat had bit.   Cat is up-to-date on vaccinations, endorses: Low concern for rabies.  Patient to try RICE, follow-up with neuro in 1 week.  Return precautions discussed, patient verbalized understanding and is agreeable to plan. Final  Clinical Impressions(s) / UC Diagnoses   Final diagnoses:  Decreased dexterity     Discharge Instructions     Recommend RICE: rest, ice, compression, elevation as needed for pain.    Heat therapy (hot compress, warm wash red, hot showers, etc.) can help relax muscles and soothe muscle aches. Cold therapy (ice packs) can be used to help swelling both after injury and after prolonged use of areas of chronic pain/aches.  For pain: recommend 350 mg-1000 mg of Tylenol (acetaminophen) and/or 200 mg - 800 mg of Advil (ibuprofen, Motrin) every 8 hours as needed.  May alternate between the two throughout the day as they are generally safe to take together.  DO NOT exceed more than 3000 mg of Tylenol or 3200 mg of ibuprofen in a 24 hour period as this could damage your stomach, kidneys, liver, or increase your bleeding risk.    ED Prescriptions    None     PDMP not reviewed this encounter.   Neldon Mc [place], [place] 06/28/19 1935

## 2019-07-04 ENCOUNTER — Telehealth: Payer: Self-pay

## 2019-07-04 ENCOUNTER — Other Ambulatory Visit: Payer: Self-pay

## 2019-07-04 ENCOUNTER — Encounter: Payer: Self-pay | Admitting: Neurology

## 2019-07-04 ENCOUNTER — Ambulatory Visit (INDEPENDENT_AMBULATORY_CARE_PROVIDER_SITE_OTHER): Payer: BC Managed Care – PPO | Admitting: Neurology

## 2019-07-04 ENCOUNTER — Telehealth: Payer: Self-pay | Admitting: Neurology

## 2019-07-04 VITALS — BP 145/70 | HR 80 | Temp 98.0°F | Ht 68.0 in | Wt 119.0 lb

## 2019-07-04 DIAGNOSIS — W5503XA Scratched by cat, initial encounter: Secondary | ICD-10-CM | POA: Diagnosis not present

## 2019-07-04 DIAGNOSIS — T79A0XS Compartment syndrome, unspecified, sequela: Secondary | ICD-10-CM

## 2019-07-04 DIAGNOSIS — G5621 Lesion of ulnar nerve, right upper limb: Secondary | ICD-10-CM | POA: Diagnosis not present

## 2019-07-04 DIAGNOSIS — S60512A Abrasion of left hand, initial encounter: Secondary | ICD-10-CM | POA: Diagnosis not present

## 2019-07-04 NOTE — Telephone Encounter (Signed)
Left vm for patient that letter was put in the mail for him that he was seen by Dr.SEthi. I stated he could also stop by the office and pick up the letter. Copy put in mail today.

## 2019-07-04 NOTE — Patient Instructions (Addendum)
I had a long discussion with the patient with regards to his right hand numbness and weakness following insect bite which likely represents injury to the right ulnar nerve possibly from the swelling and compartment syndrome which I expect to improve over the next few weeks.  Recommend EMG nerve conduction study in a month and check lab work for Lyme titer, ESR, CBC and CMP today.  Refer to occupational therapy for hand exercises.  He will return for follow-up in 2 months or call earlier if necessary.

## 2019-07-04 NOTE — Progress Notes (Signed)
[facility] [address] [place]. [place] [address] [phone]       OFFICE CONSULT NOTE  Mr. AHMOD GILLESPIE Date of Birth:  12-06-1955 Medical Record Number:  [id number]   Referring MD: Eloise Levels, PA-c  Reason for Referral:  Right hand weakness  HPI: Mr Ruark is a 50 year Caucasian male seen today for initial consultation for right hand weakness.  History is obtained from the patient and review of electronic medical records.  Patient has past medical history of hypertension, COPD who states 2 weeks ago he had a insect bite in his right distal forearm on the extensor aspect.  He shook of the insect and did not pay attention and see what it was.  He developed pain and swelling at that site and also noticed numbness in his hand as well as weakness of extension of his fingers.  He was seen in the ER on 06/27/2019 review as noted has been trouble moving his fingers decreased sensation.  He denied any tick bite but states he had been bitten by a neighbor's kitten few days ago but the kitten was up-to-date with vaccines shots. Over last 2 weeks there has been improvement in hand pain, swelling as well as numbness but weakness of finger extension persists.      ROS:   14 system review of systems is positive for hand pain, weakness, numbness, insect bite and all other systems negative  PMH:  Past Medical History:  Diagnosis Date  . Hypertension   . Lumbar herniated disc     Social History:  Social History   Socioeconomic History  . Marital status: Married    Spouse name: Not on file  . Number of children: Not on file  . Years of education: Not on file  . Highest education level: Not on file  Occupational History  . Not on file  Social Needs  . Financial resource strain: Not on file  . Food insecurity    Worry: Not on file    Inability: Not on file  . Transportation needs    Medical: Not on file    Non-medical: Not on file  Tobacco Use  . Smoking status:  Current Every Day Smoker    Packs/day: 1.00    Types: Cigarettes  . Smokeless tobacco: Never Used  Substance and Sexual Activity  . Alcohol use: Yes  . Drug use: No  . Sexual activity: Not on file  Lifestyle  . Physical activity    Days per week: Not on file    Minutes per session: Not on file  . Stress: Not on file  Relationships  . Social Herbalist on phone: Not on file    Gets together: Not on file    Attends religious service: Not on file    Active member of club or organization: Not on file    Attends meetings of clubs or organizations: Not on file    Relationship status: Not on file  . Intimate partner violence    Fear of current or ex partner: Not on file    Emotionally abused: Not on file    Physically abused: Not on file    Forced sexual activity: Not on file  Other Topics Concern  . Not on file  Social History Narrative  . Not on file    Medications:   No current outpatient medications on file prior to visit.   No current facility-administered medications on file prior to visit.  Allergies:  No Known Allergies  Physical Exam General: frail middle aged Caucasian male seated, in no evident distress Head: head normocephalic and atraumatic.   Neck: supple with no carotid or supraclavicular bruits Cardiovascular: regular rate and rhythm, no murmurs Musculoskeletal: no deformity Skin:  no rash/petichiae. Small 5 mm dodule on dorsum of right distal forearm without surrounding erythema or swelling Vascular:  Normal pulses all extremities  Neurologic Exam Mental Status: Awake and fully alert. Oriented to place and time. Recent and remote memory intact. Attention span, concentration and fund of knowledge appropriate. Mood and affect appropriate.  Cranial Nerves: Fundoscopic exam reveals sharp disc margins. Pupils equal, briskly reactive to light. Extraocular movements full without nystagmus. Visual fields full to confrontation. Hearing intact. Facial  sensation intact. Face, tongue, palate moves normally and symmetrically.  Motor: Normal bulk and tone. Normal strength in all tested extremity muscles.  Mild weakness of intrinsic hand muscles in the right hand mostly the ulnar 2 fingers abduction and adduction with preserved strength in the median mediated muscles.  Good strength in the grip, long flexors, wrist extensors and flexors.  Early clawing deformity in the medial to right fingers Sensory.: intact to touch , pinprick , position and vibratory sensation.  But slight hyperesthesia in the ulnar mediated fingers.  Tinel sign is negative over the right elbow Coordination: Rapid alternating movements normal in all extremities. Finger-to-nose and heel-to-shin performed accurately bilaterally. Gait and Station: Arises from chair without difficulty. Stance is normal. Gait demonstrates normal stride length and balance . Able to heel, toe and tandem walk without difficulty.  Reflexes: 1+ and symmetric. Toes downgoing.     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     PLAN: I had a long discussion with the patient with regards to his right hand numbness and weakness following insect bite which likely represents injury to the right ulnar nerve possibly from the swelling and compartment syndrome which I expect to improve over the next few weeks.  Recommend EMG nerve conduction study in a month and check lab work for Lyme titer,bartonella antibodies for cat scratch disease, ESR, CBC and CMP today.  Refer to occupational therapy for hand exercises.  Greater than 50% time during the 45-minute consultation was spent in counseling and coordination of care about right hand weakness and ulnar neuropathy and answering questions. He will return for follow-up in 2 months  or call earlier if necessary.  Antony Contras, MD  [facility] [address] [place], [place] [address]  Phone [phone] Fax [phone] Note: This document was prepared with digital dictation and possible smart phrase technology. Any transcriptional errors that result from this process are unintentional.

## 2019-07-04 NOTE — Telephone Encounter (Signed)
Noted  

## 2019-07-04 NOTE — Telephone Encounter (Signed)
Patient states he will need to call back to schedule any appointments needed d/t personal schedule.

## 2019-07-06 ENCOUNTER — Telehealth: Payer: Self-pay | Admitting: Rehabilitative and Restorative Service Providers"

## 2019-07-06 LAB — CBC WITH DIFFERENTIAL
Basophils Absolute: 0.1 10*3/uL (ref 0.0–0.2)
Basos: 1 %
EOS (ABSOLUTE): 0 10*3/uL (ref 0.0–0.4)
Eos: 1 %
Hematocrit: 40 % (ref 37.5–51.0)
Hemoglobin: 13.8 g/dL (ref 13.0–17.7)
Immature Grans (Abs): 0 10*3/uL (ref 0.0–0.1)
Immature Granulocytes: 0 %
Lymphocytes Absolute: 1.5 10*3/uL (ref 0.7–3.1)
Lymphs: 22 %
MCH: 34.8 pg — ABNORMAL HIGH (ref 26.6–33.0)
MCHC: 34.5 g/dL (ref 31.5–35.7)
MCV: 101 fL — ABNORMAL HIGH (ref 79–97)
Monocytes Absolute: 1.2 10*3/uL — ABNORMAL HIGH (ref 0.1–0.9)
Monocytes: 17 %
Neutrophils Absolute: 4.1 10*3/uL (ref 1.4–7.0)
Neutrophils: 59 %
RBC: 3.96 x10E6/uL — ABNORMAL LOW (ref 4.14–5.80)
RDW: 11.5 % — ABNORMAL LOW (ref 11.6–15.4)
WBC: 7 10*3/uL (ref 3.4–10.8)

## 2019-07-06 LAB — COMPREHENSIVE METABOLIC PANEL
ALT: 46 IU/L — ABNORMAL HIGH (ref 0–44)
AST: 68 IU/L — ABNORMAL HIGH (ref 0–40)
Albumin/Globulin Ratio: 1.3 (ref 1.2–2.2)
Albumin: 4.3 g/dL (ref 3.8–4.8)
Alkaline Phosphatase: 77 IU/L (ref 39–117)
BUN/Creatinine Ratio: 15 (ref 10–24)
BUN: 11 mg/dL (ref 8–27)
Bilirubin Total: 0.5 mg/dL (ref 0.0–1.2)
CO2: 26 mmol/L (ref 20–29)
Calcium: 10 mg/dL (ref 8.6–10.2)
Chloride: 90 mmol/L — ABNORMAL LOW (ref 96–106)
Creatinine, Ser: 0.74 mg/dL — ABNORMAL LOW (ref 0.76–1.27)
GFR calc Af Amer: 113 mL/min/{1.73_m2} (ref >59)
GFR calc non Af Amer: 98 mL/min/{1.73_m2} (ref >59)
Globulin, Total: 3.4 g/dL (ref 1.5–4.5)
Glucose: 96 mg/dL (ref 65–99)
Potassium: 5 mmol/L (ref 3.5–5.2)
Sodium: 131 mmol/L — ABNORMAL LOW (ref 134–144)
Total Protein: 7.7 g/dL (ref 6.0–8.5)

## 2019-07-06 LAB — LYME AB/WESTERN BLOT REFLEX
LYME DISEASE AB, QUANT, IGM: <0.8 index (ref 0.00–0.79)
Lyme IgG/IgM Ab: <0.91 {ISR} (ref 0.00–0.90)

## 2019-07-06 LAB — SEDIMENTATION RATE: Sed Rate: 21 mm/hr (ref 0–30)

## 2019-07-06 NOTE — Telephone Encounter (Signed)
Message created in error

## 2019-07-07 ENCOUNTER — Other Ambulatory Visit (HOSPITAL_COMMUNITY): Payer: Self-pay | Admitting: Neurology

## 2019-07-07 DIAGNOSIS — R29898 Other symptoms and signs involving the musculoskeletal system: Secondary | ICD-10-CM

## 2019-07-11 ENCOUNTER — Other Ambulatory Visit: Payer: Self-pay | Admitting: *Deleted

## 2019-07-11 DIAGNOSIS — R748 Abnormal levels of other serum enzymes: Secondary | ICD-10-CM

## 2019-07-19 LAB — SPECIMEN STATUS REPORT

## 2019-07-25 DIAGNOSIS — R748 Abnormal levels of other serum enzymes: Secondary | ICD-10-CM | POA: Diagnosis not present

## 2019-07-26 LAB — COMPLETE METABOLIC PANEL WITH GFR
AG Ratio: 1.2 (calc) (ref 1.0–2.5)
ALT: 26 U/L (ref 9–46)
AST: 33 U/L (ref 10–35)
Albumin: 4 g/dL (ref 3.6–5.1)
Alkaline phosphatase (APISO): 61 U/L (ref 35–144)
BUN: 21 mg/dL (ref 7–25)
CO2: 26 mmol/L (ref 20–32)
Calcium: 9.6 mg/dL (ref 8.6–10.3)
Chloride: 94 mmol/L — ABNORMAL LOW (ref 98–110)
Creat: 0.73 mg/dL (ref 0.70–1.25)
GFR, Est African American: 114 mL/min/{1.73_m2} (ref >=60)
GFR, Est Non African American: 99 mL/min/{1.73_m2} (ref >=60)
Globulin: 3.3 g/dL (calc) (ref 1.9–3.7)
Glucose, Bld: 73 mg/dL (ref 65–99)
Potassium: 5.1 mmol/L (ref 3.5–5.3)
Sodium: 132 mmol/L — ABNORMAL LOW (ref 135–146)
Total Bilirubin: 0.4 mg/dL (ref 0.2–1.2)
Total Protein: 7.3 g/dL (ref 6.1–8.1)

## 2019-07-26 LAB — SPECIMEN COMPROMISED

## 2019-07-27 LAB — BARTONELLA ANTIBODY PANEL
B Quintana IgM: NEGATIVE titer
B henselae IgG: NEGATIVE titer
B henselae IgM: NEGATIVE titer
B quintana IgG: NEGATIVE titer

## 2019-07-27 LAB — SPECIMEN STATUS REPORT

## 2019-07-28 ENCOUNTER — Other Ambulatory Visit: Payer: Self-pay

## 2019-07-28 ENCOUNTER — Ambulatory Visit (INDEPENDENT_AMBULATORY_CARE_PROVIDER_SITE_OTHER): Payer: BC Managed Care – PPO

## 2019-07-28 ENCOUNTER — Encounter: Payer: Self-pay | Admitting: *Deleted

## 2019-07-28 ENCOUNTER — Ambulatory Visit (INDEPENDENT_AMBULATORY_CARE_PROVIDER_SITE_OTHER): Payer: BC Managed Care – PPO | Admitting: Sports Medicine

## 2019-07-28 ENCOUNTER — Telehealth: Payer: Self-pay

## 2019-07-28 DIAGNOSIS — G5631 Lesion of radial nerve, right upper limb: Secondary | ICD-10-CM | POA: Diagnosis not present

## 2019-07-28 DIAGNOSIS — L989 Disorder of the skin and subcutaneous tissue, unspecified: Secondary | ICD-10-CM

## 2019-07-28 DIAGNOSIS — M19021 Primary osteoarthritis, right elbow: Secondary | ICD-10-CM | POA: Diagnosis not present

## 2019-07-28 DIAGNOSIS — M79601 Pain in right arm: Secondary | ICD-10-CM | POA: Diagnosis not present

## 2019-07-28 MED ORDER — THIAMINE HCL 100 MG PO TABS: 100.0000 mg | ORAL_TABLET | Freq: Every day | ORAL | 3 refills | Status: DC

## 2019-07-28 MED ORDER — PREDNISONE 50 MG PO TABS: ORAL_TABLET | ORAL | 0 refills | Status: DC

## 2019-07-28 NOTE — Assessment & Plan Note (Signed)
I do think this is a skin cancer, I like him to return for excisional biopsy sometime over the next few months.

## 2019-07-28 NOTE — Telephone Encounter (Signed)
Double study requiring peer to peer  Call [facility] at: [phone]  Member ID: [id number]

## 2019-07-28 NOTE — Progress Notes (Signed)
Subjective:    CC: Right wrist weakness  HPI: For the past month this pleasant 63 year old male has had weakness, numbness and tingling over the dorsum of his right forearm, he has significant weakness to extension of the wrist and severe weakness to extension of the fingers.  He was scratched and bitten by a kitten and wonders if this may be relevant.  Previously he was referred to neurology, some labs and titers were obtained that were unrevealing.  He was recommended to have a nerve conduction and EMG study that he never followed through with.  On further questioning he is a heavy drinker, 12 beers per day, tells me he is trying to cut back, he may have fallen asleep with his arm in an odd position on further questioning.  I reviewed the past medical history, family history, social history, surgical history, and allergies today and no changes were needed.  Please see the problem list section below in epic for further details.  Past Medical History: Past Medical History:  Diagnosis Date  . Hypertension   . Lumbar herniated disc    Past Surgical History: No past surgical history on file. Social History: Social History   Socioeconomic History  . Marital status: Married    Spouse name: Not on file  . Number of children: Not on file  . Years of education: Not on file  . Highest education level: Not on file  Occupational History  . Not on file  Tobacco Use  . Smoking status: Current Every Day Smoker    Packs/day: 1.00    Types: Cigarettes  . Smokeless tobacco: Never Used  Substance and Sexual Activity  . Alcohol use: Yes    Alcohol/week: 14.0 standard drinks    Types: 14 Cans of beer per week    Comment: he reported on 07/28/2019 he has cut back to  about 1-2 cans of beer a night  . Drug use: No  . Sexual activity: Not on file  Other Topics Concern  . Not on file  Social History Narrative  . Not on file   Social Determinants of Health   Financial Resource Strain:   .  Difficulty of Paying Living Expenses: Not on file  Food Insecurity:   . Worried About Charity fundraiser in the Last Year: Not on file  . Ran Out of Food in the Last Year: Not on file  Transportation Needs:   . Lack of Transportation (Medical): Not on file  . Lack of Transportation (Non-Medical): Not on file  Physical Activity:   . Days of Exercise per Week: Not on file  . Minutes of Exercise per Session: Not on file  Stress:   . Feeling of Stress : Not on file  Social Connections:   . Frequency of Communication with Friends and Family: Not on file  . Frequency of Social Gatherings with Friends and Family: Not on file  . Attends Religious Services: Not on file  . Active Member of Clubs or Organizations: Not on file  . Attends Archivist Meetings: Not on file  . Marital Status: Not on file   Family History: Family History  Problem Relation Age of Onset  . Heart attack Father    Allergies: No Known Allergies Medications: See med rec.  Review of Systems: No fevers, chills, night sweats, weight loss, chest pain, or shortness of breath.   Objective:    General: Well Developed, well nourished, and in no acute distress.  Neuro: Alert and  oriented x3, extra-ocular muscles intact, sensation grossly intact.  HEENT: Normocephalic, atraumatic, pupils equal round reactive to light, neck supple, no masses, no lymphadenopathy, thyroid nonpalpable.  Skin: Warm and dry, no rashes.  There is a 1 cm scaly appearing rash over the dorsum of his right midforearm consistent with a squamous cell carcinoma.  We will bring him back for biopsy of this. Cardiac: Regular rate and rhythm, no murmurs rubs or gallops, no lower extremity edema.  Respiratory: Clear to auscultation bilaterally. Not using accessory muscles, speaking in full sentences. Right arm: 3/5 strength of wrist extension, 1/5 strength to finger extension on the right.  Subjective paresthesias over the dorsum of the right wrist  and forearm.  Good strength of flexion and ulnar deviation of the wrist, good grip strength.  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 I do not think the cat bite/scratch is relevant here. On further questioning he is a heavy drinker, approximately a 12 pack per night, this is likely related to an episode of neurapraxia. I did explain to him that the prognosis was good, motor function would likely return but could take months. He did have a low sodium and low chloride as well as elevated liver function tests all consistent with beer drinker potomania. 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 I do think he should proceed with the nerve conduction and EMG as previously recommended by his neurologist. Certainly if there is evidence of a focal radial nerve compression I can perform an ultrasound-guided hydrodissection. Adding a Velcro wrist brace to prevent flexion contracture, x-rays of the right forearm, MRI of the right arm as well with special attention paid to the radial nerve course. 5 days of prednisone, adding thiamine 100 mg daily oral. Referral to hand therapy. Return to see me in 1 month.   Skin lesion of right arm I do think this is a skin cancer, I like him to return for excisional biopsy sometime over the next few months.   ___________________________________________ Gwen Her. Dianah Field, M.D., ABFM., CAQSM. Primary Care and Sports Medicine [facility]  Adjunct Professor of [employer] [facility] [employer]

## 2019-07-28 NOTE — Assessment & Plan Note (Signed)
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 I do not think the cat bite/scratch is relevant here. On further questioning he is a heavy drinker, approximately a 12 pack per night, this is likely related to an episode of neurapraxia. I did explain to him that the prognosis was good, motor function would likely return but could take months. He did have a low sodium and low chloride as well as elevated liver function tests all consistent with beer drinker potomania. 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 I do think he should proceed with the nerve conduction and EMG as previously recommended by his neurologist. Certainly if there is evidence of a focal radial nerve compression I can perform an ultrasound-guided hydrodissection. Adding a Velcro wrist brace to prevent flexion contracture, x-rays of the right forearm, MRI of the right arm as well with special attention paid to the radial nerve course. 5 days of prednisone, adding thiamine 100 mg daily oral. Referral to hand therapy. Return to see me in 1 month.

## 2019-07-31 ENCOUNTER — Telehealth: Payer: Self-pay

## 2019-07-31 MED ORDER — TRIAZOLAM 0.25 MG PO TABS: ORAL_TABLET | ORAL | 0 refills | Status: DC

## 2019-07-31 NOTE — Telephone Encounter (Signed)
Pt advised.

## 2019-07-31 NOTE — Telephone Encounter (Signed)
Thank you!!!!!  Imaging advised, getting patient scheduled

## 2019-07-31 NOTE — Telephone Encounter (Signed)
Patient is claustrophobic. Needing pre-med for double study. He is scheduled for 08/05/19  Please send to [facility]

## 2019-07-31 NOTE — Telephone Encounter (Signed)
Triazolam called in, he needs a driver.

## 2019-07-31 NOTE — Telephone Encounter (Signed)
Auth #: [id number] exp: 01/23/2020.  Lets get him scheduled, today or tomo.

## 2019-08-01 LAB — COMPLETE METABOLIC PANEL WITH GFR
AG Ratio: 1.3 (calc) (ref 1.0–2.5)
ALT: 28 U/L (ref 9–46)
AST: 29 U/L (ref 10–35)
Albumin: 4.2 g/dL (ref 3.6–5.1)
Alkaline phosphatase (APISO): 60 U/L (ref 35–144)
BUN: 23 mg/dL (ref 7–25)
CO2: 28 mmol/L (ref 20–32)
Calcium: 9.8 mg/dL (ref 8.6–10.3)
Chloride: 98 mmol/L (ref 98–110)
Creat: 0.78 mg/dL (ref 0.70–1.25)
GFR, Est African American: 111 mL/min/{1.73_m2} (ref >=60)
GFR, Est Non African American: 96 mL/min/{1.73_m2} (ref >=60)
Globulin: 3.2 g/dL (calc) (ref 1.9–3.7)
Glucose, Bld: 95 mg/dL (ref 65–99)
Potassium: 5 mmol/L (ref 3.5–5.3)
Sodium: 134 mmol/L — ABNORMAL LOW (ref 135–146)
Total Bilirubin: 0.5 mg/dL (ref 0.2–1.2)
Total Protein: 7.4 g/dL (ref 6.1–8.1)

## 2019-08-01 LAB — HEAVY METALS PANEL, BLOOD
Arsenic: <10 mcg/L (ref <23)
Lead: 1 ug/dL (ref <5)
Mercury, B: <5 mcg/L (ref 0–10)

## 2019-08-01 LAB — CBC
HCT: 36 % — ABNORMAL LOW (ref 38.5–50.0)
Hemoglobin: 12.6 g/dL — ABNORMAL LOW (ref 13.2–17.1)
MCH: 34.9 pg — ABNORMAL HIGH (ref 27.0–33.0)
MCHC: 35 g/dL (ref 32.0–36.0)
MCV: 99.7 fL (ref 80.0–100.0)
MPV: 9.1 fL (ref 7.5–12.5)
Platelets: 367 10*3/uL (ref 140–400)
RBC: 3.61 10*6/uL — ABNORMAL LOW (ref 4.20–5.80)
RDW: 11.3 % (ref 11.0–15.0)
WBC: 8.3 10*3/uL (ref 3.8–10.8)

## 2019-08-01 LAB — VITAMIN B1: Vitamin B1 (Thiamine): 6 nmol/L — ABNORMAL LOW (ref 8–30)

## 2019-08-01 LAB — VITAMIN B12: Vitamin B-12: 500 pg/mL (ref 200–1100)

## 2019-08-06 ENCOUNTER — Other Ambulatory Visit: Payer: Self-pay

## 2019-08-06 ENCOUNTER — Ambulatory Visit: Payer: BC Managed Care – PPO

## 2019-08-06 ENCOUNTER — Ambulatory Visit (INDEPENDENT_AMBULATORY_CARE_PROVIDER_SITE_OTHER): Payer: BC Managed Care – PPO

## 2019-08-06 DIAGNOSIS — G5631 Lesion of radial nerve, right upper limb: Secondary | ICD-10-CM | POA: Diagnosis not present

## 2019-08-15 ENCOUNTER — Ambulatory Visit: Payer: BC Managed Care – PPO | Admitting: Occupational Therapy

## 2019-08-15 ENCOUNTER — Ambulatory Visit: Payer: BC Managed Care – PPO | Admitting: Rehabilitative and Restorative Service Providers"

## 2019-08-24 ENCOUNTER — Ambulatory Visit: Payer: BC Managed Care – PPO | Admitting: Family Medicine

## 2019-08-24 ENCOUNTER — Ambulatory Visit (INDEPENDENT_AMBULATORY_CARE_PROVIDER_SITE_OTHER): Payer: BC Managed Care – PPO | Admitting: Sports Medicine

## 2019-08-24 ENCOUNTER — Other Ambulatory Visit: Payer: Self-pay

## 2019-08-24 DIAGNOSIS — G5631 Lesion of radial nerve, right upper limb: Secondary | ICD-10-CM

## 2019-08-24 NOTE — Progress Notes (Signed)
    Procedures performed today:    None.  Independent interpretation of tests performed by another provider:   None.  Impression and Recommendations:    Radial nerve palsy, right Beaux returns, he is a pleasant 64 year old male, he is a heavy alcohol drinker, he is back up to at least a sixpack a day. Previously a 12 pack a day. He developed a Saturday night palsy with dense radial nerve paralysis, we did a burst of prednisone, thiamine, he has improved wrist extension but finger extension is still very weak. He is going to start hand therapy, continue his brace, he has not yet had his nerve conduction study. I did explain to him that radial nerve palsy can take months to resolve, MRI confirmed denervation edema in the radial nerve innervated muscles but no focal area of nerve compression. I have again encouraged him to discuss his alcohol problem with his PCP. Return to see me in 1 to 2 months.    ___________________________________________ Gwen Her. Dianah Field, M.D., ABFM., CAQSM. Primary Care and [place] Instructor of [employer] [facility] of Medicine

## 2019-08-24 NOTE — Assessment & Plan Note (Addendum)
Chauncy returns, he is a pleasant 64 year old male, he is a heavy alcohol drinker, he is back up to at least a sixpack a day. Previously a 12 pack a day. He developed a Saturday night palsy with dense radial nerve paralysis, we did a burst of prednisone, thiamine, he has improved wrist extension but finger extension is still very weak. He is going to start hand therapy, continue his brace, he has not yet had his nerve conduction study. I did explain to him that radial nerve palsy can take months to resolve, MRI confirmed denervation edema in the radial nerve innervated muscles but no focal area of nerve compression. I have again encouraged him to discuss his alcohol problem with his PCP. Return to see me in 1 to 2 months.

## 2019-08-25 ENCOUNTER — Ambulatory Visit: Payer: BC Managed Care – PPO | Admitting: Family Medicine

## 2019-08-30 ENCOUNTER — Other Ambulatory Visit (HOSPITAL_COMMUNITY): Payer: Self-pay | Admitting: Neurology

## 2019-09-22 ENCOUNTER — Ambulatory Visit: Payer: BC Managed Care – PPO | Admitting: Sports Medicine

## 2019-09-29 ENCOUNTER — Ambulatory Visit (INDEPENDENT_AMBULATORY_CARE_PROVIDER_SITE_OTHER): Payer: BC Managed Care – PPO | Admitting: Physical Therapy

## 2019-09-29 ENCOUNTER — Telehealth: Payer: Self-pay | Admitting: *Deleted

## 2019-09-29 ENCOUNTER — Other Ambulatory Visit: Payer: Self-pay

## 2019-09-29 ENCOUNTER — Encounter: Payer: Self-pay | Admitting: Physical Therapy

## 2019-09-29 DIAGNOSIS — G5631 Lesion of radial nerve, right upper limb: Secondary | ICD-10-CM

## 2019-09-29 DIAGNOSIS — R531 Weakness: Secondary | ICD-10-CM

## 2019-09-29 DIAGNOSIS — M25531 Pain in right wrist: Secondary | ICD-10-CM

## 2019-09-29 DIAGNOSIS — M25631 Stiffness of right wrist, not elsewhere classified: Secondary | ICD-10-CM | POA: Diagnosis not present

## 2019-09-29 MED ORDER — GABAPENTIN 300 MG PO CAPS: ORAL_CAPSULE | ORAL | 3 refills | Status: DC

## 2019-09-29 NOTE — Telephone Encounter (Signed)
Adding gabapentin in a slow up taper.  Is his strength improving at all?

## 2019-09-29 NOTE — Patient Instructions (Signed)
   Massage and rub Rt hand, wrist and forearm a lot  TENS UNIT: This is helpful for muscle pain and spasm.   Search and Purchase a TENS 7000 2nd edition at www.tenspros.com. It should be less than $30.     TENS unit instructions: Do not shower or bathe with the unit on Turn the unit off before removing electrodes or batteries If the electrodes lose stickiness add a drop of water to the electrodes after they are disconnected from the unit and place on plastic sheet. If you continued to have difficulty, call the TENS unit company to purchase more electrodes. Do not apply lotion on the skin area prior to use. Make sure the skin is clean and dry as this will help prolong the life of the electrodes. After use, always check skin for unusual red areas, rash or other skin difficulties. If there are any skin problems, does not apply electrodes to the same area. Never remove the electrodes from the unit by pulling the wires. Do not use the TENS unit or electrodes other than as directed. Do not change electrode placement without consultating your therapist or physician. Keep 2 fingers with between each electrode. Wear time ratio is 2:1, on to off times.    For example on for 30 minutes off for 15 minutes and then on for 30 minutes off for 15 minutes

## 2019-09-29 NOTE — Therapy (Signed)
[place] [facility] [place], [place], [address] Phone: [phone]   Fax:  [phone]  Physical Therapy Evaluation  Patient Details  Name: Jason Page MRN: [id number] Date of Birth: Mar 18, 1956 Referring Provider (PT): Dr Aundria Mems   Encounter Date: 09/29/2019  PT End of Session - 09/29/19 0843    Visit Number  1    Number of Visits  6    Date for PT Re-Evaluation  11/10/19    Authorization Type  BCBS    PT Start Time  [phone]    PT Stop Time  0940    PT Time Calculation (min)  57 min    Activity Tolerance  Patient limited by pain    Behavior During Therapy  [facility] for tasks assessed/performed       Past Medical History:  Diagnosis Date  . Hypertension   . Lumbar herniated disc     History reviewed. No pertinent surgical history.  There were no vitals filed for this visit.   Subjective Assessment - 09/29/19 0845    Subjective  Pt reports he developed Rt wrist pain about 5 months ago.  He doesn't remember an incident. He as been wearing a cockup brace for about 2 months and it seems to make the pain worse.  He is Rt handed and is limited in everything.  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    Currently in Pain?  Yes    Pain Score  10-Worst pain ever    Pain Location  Arm    Pain Orientation  Right    Pain Type  Chronic pain    Pain Radiating Towards  mid wrist to end of fingers  - mainly 4th and 5th.    Pain Onset  More than a month ago    Pain Frequency  Constant    Aggravating Factors   hurts all the time.    Pain Relieving Factors  soaking in epson salt - temp relief         [facility] PT Assessment - 09/29/19 0001      Assessment   Medical Diagnosis  Rt radial nerve palsy    Referring Provider (PT)  Dr Aundria Mems    Onset Date/Surgical Date  04/29/19    Hand  Dominance  Right    Next MD Visit  PRN    Prior Therapy  none      Precautions   Precautions  None   for hand   Required Braces or Orthoses  Other Brace/Splint    Other Brace/Splint  Rt cock up brace      Balance Screen   Has the patient fallen in the past 6 months  No   had had near falls at work   Has the patient had a decrease in activity level because of a fear of falling?   No    Is the patient reluctant to leave their home because of a fear of falling?   No      Prior Function   Level of Independence  Independent   some difficulty with fine motor tasks   Vocation  Full time employment    [employer]      Observation/Other Assessments   Focus on Therapeutic Outcomes (FOTO)   54% limited  Observation/Other Assessments-Edema    Edema  Circumferential      Circumferential Edema   Circumferential - Right  mid hand 21cm muscle atrophy, wrist 18cm    Circumferential - Left   mid hand 22 cm, wrist 16 cm       Sensation   Light Touch  Appears Intact    Hot/Cold  Appears Intact    Proprioception  Appears Intact    Additional Comments  Rt fingers cold and slight discoloration      Coordination   Fine Motor Movements are Fluid and Coordinated  No      Posture/Postural Control   Posture/Postural Control  Postural limitations    Postural Limitations  Forward head;Rounded Shoulders      Deep Tendon Reflexes   DTR Assessment Site  Triceps;Brachioradialis;Biceps    Biceps DTR  1+    Brachioradialis DTR  0    Triceps DTR  1+      ROM / Strength   AROM / PROM / Strength  AROM;PROM;Strength      AROM   AROM Assessment Site  Shoulder;Elbow;Forearm;Wrist;Finger    Right/Left Shoulder  --   WNL   Right/Left Elbow  --   WNL   Right/Left Forearm  --   Rt WNL however slow with supination -cogwheel   Right/Left Wrist  Right    Right Wrist Extension  -5 Degrees    Right Wrist Flexion  48 Degrees    Right Wrist Radial Deviation  5 Degrees     Right Wrist Ulnar Deviation  18 Degrees    Left Wrist Flexion  --    Right/Left Finger  --   opposition with thumb to all fingers      PROM   PROM Assessment Site  Wrist;Finger    Right/Left Wrist  Right    Right Wrist Extension  25 Degrees   tremors in wrist   Right Wrist Flexion  58 Degrees    Right Wrist Radial Deviation  17 Degrees    Right Wrist Ulnar Deviation  25 Degrees    Right/Left Finger  --   Rt all limited extension to neutral      Strength   Strength Assessment Site  Shoulder;Elbow;Hand;Wrist    Right/Left Shoulder  --   WNL except ER Rt 4-/5, Lt 4/5   Right/Left Elbow  --   WNL   Right/Left hand  Left;Right    Right Hand Grip (lbs)  30    Left Hand Grip (lbs)  65      Palpation   Palpation comment  hypomobile with pain in Rt carpal, numbness in 4th and 5th fingers pain in palpation wirist and hand                Objective measurements completed on examination: See above findings.      [facility] Adult PT Treatment/Exercise - 09/29/19 0001      Exercises   Exercises  Wrist      Wrist Exercises   Wrist Extension  AROM;Right;10 reps;Seated    Wrist Radial Deviation  AROM;Right;10 reps;Seated    Other wrist exercises  grip Rt 10 reps, 10 reps Rt hand down with elbow lifts.       Modalities   Modalities  Electrical Stimulation;Cryotherapy      Cryotherapy   Number Minutes Cryotherapy  10 Minutes    Cryotherapy Location  Wrist   rt   Type of Cryotherapy  Ice pack      Electrical Stimulation  Electrical Stimulation Location  Rt wrist - one electrode on each side    Electrical Stimulation Action  premod    Electrical Stimulation Parameters  to tolerance    Electrical Stimulation Goals  Edema;Pain             PT Education - 09/29/19 0949    Education Details  HEP, home TENS and POC    Person(s) Educated  Patient    Methods  Explanation;Demonstration;Handout    Comprehension  Returned demonstration;Verbalized understanding           PT Long Term Goals - 09/29/19 0959      PT LONG TERM GOAL #1   Title  I with HEP ( 11/10/2019)    Time  6    Period  Weeks    Status  New    Target Date  11/10/19      PT LONG TERM GOAL #2   Title  improve Rt wrist AROM to [facility] with minimal to no pain ( 11/10/2019)    Time  6    Period  Weeks    Status  New    Target Date  11/10/19      PT LONG TERM GOAL #3   Title  improve FOTO =/< 35% limited ( 11/10/2019)    Time  6    Period  Weeks    Status  New    Target Date  11/10/19      PT LONG TERM GOAL #4   Title  report =/> 50% reduction in pain to allow him to perform fine motor skills for dressing ( 11/10/2019)    Time  6    Period  Weeks    Status  New    Target Date  11/10/19      PT LONG TERM GOAL #5   Title  improve Rt grip strength to within 5# of Lt ( 11/10/2019)    Time  6    Period  Weeks    Status  New    Target Date  11/10/19             Plan - 09/29/19 0950    Clinical Impression Statement  64 yo with insideous onset of Rt wrist pain.  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  His pain is severe, edema in the wrist, his fingers are cold, there is slight discoloration, visible atrophy in the forearm and hand.. Pt did have some relief with ice and stim and was able to actively move his Rt 4th and 5th finger while stim was on.  Pt reports he had an old Rt wrist fx when he was in his 61s however had full function.    Personal Factors and Comorbidities  Behavior Pattern;Comorbidity 2;Social Background    Examination-Activity Limitations  Carry;Dressing;Hygiene/Grooming;Lift    Examination-Participation Restrictions  Other    Stability/Clinical Decision Making  Evolving/Moderate complexity    Clinical Decision Making  Moderate    Rehab Potential  Good    PT Frequency  1x / week   per pt request - work schedule   PT Duration  6 weeks    PT Treatment/Interventions  Iontophoresis 4mg /ml  Dexamethasone;Taping;Vasopneumatic Device;Patient/family education;Functional mobility training;Moist Heat;Ultrasound;Cryotherapy;Electrical Stimulation;Neuromuscular re-education;Manual techniques;Therapeutic exercise;Passive range of motion;Dry needling    PT Next Visit Plan  improve Rt wrist and finger ROM, work on facilitation of motion/strength and modaltities for pain, if able weigth bearing through UE    Consulted  and Agree with Plan of Care  Patient       Patient will benefit from skilled therapeutic intervention in order to improve the following deficits and impairments:  Decreased range of motion, Impaired UE functional use, Pain, Hypermobility, Hypomobility, Postural dysfunction, Increased edema, Impaired sensation, Decreased strength  Visit Diagnosis: Pain in right wrist - Plan: PT plan of care cert/re-cert  General weakness - Plan: PT plan of care cert/re-cert  Stiffness of right wrist, not elsewhere classified - Plan: PT plan of care cert/re-cert     Problem List Patient Active Problem List   Diagnosis Date Noted  . Radial nerve palsy, right 07/28/2019  . Skin lesion of right arm 07/28/2019  . Basal cell carcinoma (BCC) of face 04/07/2019  . COPD exacerbation ([place]) 09/07/2016  . Allergic conjunctivitis of both eyes 12/06/2015  . Essential hypertension, benign 09/20/2013  . Tobacco abuse 09/20/2013  . Right knee pain 09/12/2012  . PAIN IN JOINT, ANKLE/FOOT 01/18/2007  . TOBACCO DEPENDENCE 05/25/2006  . LOW BACK PAIN 05/25/2006    Jeral Pinch PT  09/29/2019, 10:04 AM  [facility] [place], [place], [address] Phone: [phone]   Fax:  [phone]  Name: Jason Page MRN: [id number] Date of Birth: 1956/07/09

## 2019-09-29 NOTE — Telephone Encounter (Signed)
Pt called today stating that his wrist pain is currently at a 12 out of 10 pain.  He said he's been eating Tylenol and ibuprofen like "m&m's" and is getting no relief at all.  He is going to pt and his next appointment is in one week.  Please advise.

## 2019-09-29 NOTE — Telephone Encounter (Signed)
Pt notified of rx. 

## 2019-10-09 ENCOUNTER — Encounter: Payer: BC Managed Care – PPO | Admitting: Rehabilitative and Restorative Service Providers"

## 2019-10-09 ENCOUNTER — Ambulatory Visit (INDEPENDENT_AMBULATORY_CARE_PROVIDER_SITE_OTHER): Payer: BC Managed Care – PPO | Admitting: Rehabilitative and Restorative Service Providers"

## 2019-10-09 ENCOUNTER — Other Ambulatory Visit: Payer: Self-pay

## 2019-10-09 ENCOUNTER — Encounter: Payer: Self-pay | Admitting: Rehabilitative and Restorative Service Providers"

## 2019-10-09 DIAGNOSIS — M25631 Stiffness of right wrist, not elsewhere classified: Secondary | ICD-10-CM

## 2019-10-09 DIAGNOSIS — R531 Weakness: Secondary | ICD-10-CM

## 2019-10-09 DIAGNOSIS — M25531 Pain in right wrist: Secondary | ICD-10-CM

## 2019-10-09 NOTE — Therapy (Addendum)
[place], [place], [address] Phone: [phone]   Fax:  [phone]  Physical Therapy Treatment and Discharge Summary  Patient Details  Name: Jason Page MRN: [id number] Date of Birth: [birth year] Referring Provider (PT): Dr Aundria Mems   Encounter Date: 10/09/2019  PT End of Session - 10/09/19 2057    Visit Number  2    Number of Visits  6    Date for PT Re-Evaluation  11/10/19    Authorization Type  BCBS    PT Start Time  1520    PT Stop Time  1600    PT Time Calculation (min)  40 min    Activity Tolerance  Patient limited by pain    Behavior During Therapy  [name] for tasks assessed/performed       Past Medical History:  Diagnosis Date  . Hypertension   . Lumbar herniated disc     History reviewed. No pertinent surgical history.  There were no vitals filed for this visit.  Subjective Assessment - 10/09/19 2055    Subjective  The patient arrives today reporting pain in his R wrist and not having time yet to participate in HEP established at initial evaluation.    Patient Stated Goals  he has tried everything else so wants to give [place] a try and get his Rt hand back.    Currently in Pain?  Yes    Pain Score  4     Pain Location  Wrist    Pain Orientation  Right    Pain Descriptors / Indicators  Sore;Tightness    Pain Type  Chronic pain    Pain Onset  More than a month ago    Pain Frequency  Constant    Aggravating Factors   sore after being in splint all day    Pain Relieving Factors  reduced significantly with therapy today                       [facility] Adult PT Treatment/Exercise - 10/09/19 2058      Exercises   Exercises  Wrist;Other Exercises    Other Exercises   weight bearing through R wrist with wall press up, pressing palm into door frame for wrist extension strech and weight bearing through elevated mat table.  Also performed radial nerve flossing.      Wrist Exercises   Wrist Flexion  PROM;AROM;Right;10 reps    Wrist Extension  PROM;AROM;Strengthening;Right;10 reps    Wrist Extension Limitations  Patient is unable to extend wrist with finger extension, PT performed passive stretching and then patinet performed AROM for wrist extension    Wrist Radial Deviation  Strengthening;Right;10 reps    Bar Weights/Barbell (Radial Deviation)  1 lb    Wrist Radial Deviation Limitations  performed for AROM x 5 reps, then added 1 lb    Other wrist exercises  finkelstein's stretch with elbow extended x 2 reps x 30 seconds, pronation/supination with 1 lb weight R UE x 10 reps    Other wrist exercises  PROM thumb MCP joint with AROM and PROM with overpressure of thumb; AROM and isometrics for trumb extension isolated DIP and then composite      Modalities   Modalities  Cryotherapy      Cryotherapy   Number Minutes Cryotherapy  6 Minutes    Cryotherapy Location  Wrist    Type of Cryotherapy  Ice massage      Manual  Therapy   Manual Therapy  Joint mobilization;Soft tissue mobilization    Manual therapy comments  to reduce pain and improve motion    Joint Mobilization  wrist distraction and carpal mobilization into wrist extension, mobilization radius with pronation movement    Soft tissue mobilization  wrist extensors              PT Education - 10/09/19 2057    Education Details  HEP progression    Person(s) Educated  Patient    Methods  Explanation;Demonstration;Handout    Comprehension  Returned demonstration;Verbalized understanding          PT Long Term Goals - 09/29/19 0959      PT LONG TERM GOAL #1   Title  I with HEP ( 11/10/2019)    Time  6    Period  Weeks    Status  New    Target Date  11/10/19      PT LONG TERM GOAL #2   Title  improve Rt wrist AROM to [facility] with minimal to no pain ( 11/10/2019)    Time  6    Period  Weeks    Status  New    Target Date  11/10/19      PT LONG TERM GOAL #3   Title  improve FOTO =/<  35% limited ( 11/10/2019)    Time  6    Period  Weeks    Status  New    Target Date  11/10/19      PT LONG TERM GOAL #4   Title  report =/> 50% reduction in pain to allow him to perform fine motor skills for dressing ( 11/10/2019)    Time  6    Period  Weeks    Status  New    Target Date  11/10/19      PT LONG TERM GOAL #5   Title  improve Rt grip strength to within 5# of Lt ( 11/10/2019)    Time  6    Period  Weeks    Status  New    Target Date  11/10/19            Plan - 10/09/19 2112    Clinical Impression Statement  The patient has significant reduction in pain in wrist during therapy today from 4/10 to 0/10.  He also got improved ROM with exercise.  We discussed continuation of HEP and improving compliance.  PT educated patient on ice massage for after work due to repetitive nature and continued inflammation of R wrist.  Patient is wearing splint during work hours.    PT Frequency  1x / week    PT Duration  6 weeks    PT Treatment/Interventions  Iontophoresis '4mg'$ /ml Dexamethasone;Taping;Vasopneumatic Device;Patient/family education;Functional mobility training;Moist Heat;Ultrasound;Cryotherapy;Electrical Stimulation;Neuromuscular re-education;Manual techniques;Therapeutic exercise;Passive range of motion;Dry needling    PT Next Visit Plan  improve Rt wrist and finger ROM, work on facilitation of motion/strength and modaltities for pain, if able weigth bearing through UE    Consulted and Agree with Plan of Care  Patient       Patient will benefit from skilled therapeutic intervention in order to improve the following deficits and impairments:  Decreased range of motion, Impaired UE functional use, Pain, Hypermobility, Hypomobility, Postural dysfunction, Increased edema, Impaired sensation, Decreased strength  Visit Diagnosis: Pain in right wrist  General weakness  Stiffness of right wrist, not elsewhere classified    PHYSICAL THERAPY DISCHARGE SUMMARY  Visits from  Start of  Care: 2  Current functional level related to goals / functional outcomes: See above   Remaining deficits: See note for status, patient did not return   Education / Equipment: Home program.  Plan: Patient agrees to discharge.  Patient goals were not met. Patient is being discharged due to not returning since the last visit.  ?????         Thank you for the referral of this patient. Rudell Cobb, MPT   Problem List Patient Active Problem List   Diagnosis Date Noted  . Radial nerve palsy, right 07/28/2019  . Skin lesion of right arm 07/28/2019  . Basal cell carcinoma (BCC) of face 04/07/2019  . COPD exacerbation ([place]) 09/07/2016  . Allergic conjunctivitis of both eyes 12/06/2015  . Essential hypertension, benign 09/20/2013  . Tobacco abuse 09/20/2013  . Right knee pain 09/12/2012  . PAIN IN JOINT, ANKLE/FOOT 01/18/2007  . TOBACCO DEPENDENCE 05/25/2006  . LOW BACK PAIN 05/25/2006    Lasalle Abee, PT 10/09/2019, 9:14 PM  [facility] [place], [place], [address] Phone: [phone]   Fax:  [phone]  Name: Jason Page MRN: [id number] Date of Birth: 04-Feb-1956

## 2019-10-09 NOTE — Patient Instructions (Signed)
Access Code: [email or web address]  URL: [email or web address]  Date: 10/09/2019  Prepared by: Rudell Cobb   Exercises Towel Roll Grip on Table - 10 reps - 1 sets - 2x daily - 7x weekly Wrist Flexion Extension AROM - Palms Down - 10 reps - 1 sets - 2x daily - 7x weekly Wrist Radial Deviation AROM - 10 reps - 1 sets - 2x daily - 7x weekly Wrist Extension Self Stretch - 10 reps - 1 sets - 2x daily - 7x weekly Finkelstein Stretch - 10 reps - 1 sets - 2x daily - 7x weekly Radial Nerve Flossing - 10 reps - 1 sets - 2x daily - 7x weekly Push-Up on Counter - 10 reps - 1 sets - 2x daily - 7x weekly

## 2019-10-19 ENCOUNTER — Encounter: Payer: Self-pay | Admitting: Rehabilitative and Restorative Service Providers"

## 2020-06-28 ENCOUNTER — Ambulatory Visit (INDEPENDENT_AMBULATORY_CARE_PROVIDER_SITE_OTHER): Payer: BC Managed Care – PPO | Admitting: Family Medicine

## 2020-06-28 ENCOUNTER — Encounter: Payer: Self-pay | Admitting: Family Medicine

## 2020-06-28 VITALS — BP 177/85 | HR 79 | Ht 68.0 in | Wt 121.7 lb

## 2020-06-28 DIAGNOSIS — R748 Abnormal levels of other serum enzymes: Secondary | ICD-10-CM

## 2020-06-28 DIAGNOSIS — H00011 Hordeolum externum right upper eyelid: Secondary | ICD-10-CM

## 2020-06-28 DIAGNOSIS — J441 Chronic obstructive pulmonary disease with (acute) exacerbation: Secondary | ICD-10-CM | POA: Diagnosis not present

## 2020-06-28 DIAGNOSIS — I10 Essential (primary) hypertension: Secondary | ICD-10-CM

## 2020-06-28 MED ORDER — ALBUTEROL SULFATE HFA 108 (90 BASE) MCG/ACT IN AERS: 2.0000 | INHALATION_SPRAY | Freq: Four times a day (QID) | RESPIRATORY_TRACT | 2 refills | Status: DC | PRN

## 2020-06-28 MED ORDER — AMLODIPINE BESYLATE 5 MG PO TABS: 5.0000 mg | ORAL_TABLET | Freq: Every day | ORAL | 3 refills | Status: DC

## 2020-06-28 MED ORDER — PREDNISONE 20 MG PO TABS: 40.0000 mg | ORAL_TABLET | Freq: Every day | ORAL | 0 refills | Status: DC

## 2020-06-28 MED ORDER — ERYTHROMYCIN 5 MG/GM OP OINT: TOPICAL_OINTMENT | OPHTHALMIC | 0 refills | Status: DC

## 2020-06-28 NOTE — Progress Notes (Signed)
Acute Office Visit  Subjective:    Patient ID: JOVEN MOM, male    DOB: 07-16-1956, 64 y.o.   MRN: [id number]  Chief Complaint  Patient presents with  . Shortness of Breath    HPI Patient is in today for SOB x couple of days.  He said he actually started working on a new job, he is Clinical biochemist, about 6 weeks ago and has had about 5 episodes particularly at night where he woke up in the middle the night feeling really short of breath.  Says he has been exposed to a lot of very fine white powder type dust he said he is not even sure really what it is but it literally coats his clothing and his boots at the end of the day.  He has had a slight cough.  No sputum production.  No sore throat.  No fevers chills or sweats.  He has been vaccinated against Covid.  He says that he would use his albuterol inhaler and it would provide relief but he actually ran out.  He says he actually started wearing a mask to work over the last few days and has felt like it is been helpful he feels like he has been able to actually breathe better.  He also has a stye on his right upper eyelid that has also been there for probably about a month he has been doing the warm compresses but it does not seem to improve.  Past Medical History:  Diagnosis Date  . Hypertension   . Lumbar herniated disc     History reviewed. No pertinent surgical history.  Family History  Problem Relation Age of Onset  . Heart attack Father     Social History   Socioeconomic History  . Marital status: Married    Spouse name: Not on file  . Number of children: Not on file  . Years of education: Not on file  . Highest education level: Not on file  Occupational History  . Not on file  Tobacco Use  . Smoking status: Current Every Day Smoker    Packs/day: 1.00    Types: Cigarettes  . Smokeless tobacco: Never Used  Substance and Sexual Activity  . Alcohol use: Yes    Alcohol/week: 14.0 standard drinks    Types: 14 Cans of  beer per week    Comment: he reported on 07/28/2019 he has cut back to  about 1-2 cans of beer a night  . Drug use: No  . Sexual activity: Not on file  Other Topics Concern  . Not on file  Social History Narrative  . Not on file   Social Determinants of Health   Financial Resource Strain:   . Difficulty of Paying Living Expenses: Not on file  Food Insecurity:   . Worried About Charity fundraiser in the Last Year: Not on file  . Ran Out of Food in the Last Year: Not on file  Transportation Needs:   . Lack of Transportation (Medical): Not on file  . Lack of Transportation (Non-Medical): Not on file  Physical Activity:   . Days of Exercise per Week: Not on file  . Minutes of Exercise per Session: Not on file  Stress:   . Feeling of Stress : Not on file  Social Connections:   . Frequency of Communication with Friends and Family: Not on file  . Frequency of Social Gatherings with Friends and Family: Not on file  . Attends Religious Services:  Not on file  . Active Member of Clubs or Organizations: Not on file  . Attends Archivist Meetings: Not on file  . Marital Status: Not on file  Intimate Partner Violence:   . Fear of Current or Ex-Partner: Not on file  . Emotionally Abused: Not on file  . Physically Abused: Not on file  . Sexually Abused: Not on file    Outpatient Medications Prior to Visit  Medication Sig Dispense Refill  . gabapentin (NEURONTIN) 300 MG capsule One tab PO qHS for a week, then BID for a week, then TID. May double weekly to a max of 3,600mg /day 30 capsule 3  . thiamine 100 MG tablet Take 1 tablet (100 mg total) by mouth daily. 90 tablet 3  . predniSONE (DELTASONE) 50 MG tablet One tab PO daily for 5 days. (Patient not taking: Reported on 06/28/2020) 5 tablet 0  . triazolam (HALCION) 0.25 MG tablet 1-2 tabs PO 1 hour before procedure or imaging.  Do not drive with this medication. (Patient not taking: Reported on 06/28/2020) 2 tablet 0   No  facility-administered medications prior to visit.    No Known Allergies  Review of Systems     Objective:    Physical Exam Constitutional:      Appearance: He is well-developed.  HENT:     Head: Normocephalic and atraumatic.  Cardiovascular:     Rate and Rhythm: Normal rate and regular rhythm.     Heart sounds: Normal heart sounds.  Pulmonary:     Effort: Pulmonary effort is normal.     Breath sounds: No decreased breath sounds.  Skin:    General: Skin is warm and dry.  Neurological:     Mental Status: He is alert and oriented to person, place, and time.  Psychiatric:        Behavior: Behavior normal.     BP (!) 177/85 (BP Location: Left Arm, Patient Position: Sitting, Cuff Size: Small)   Pulse 79   Ht 5\' 8"  (1.727 m)   Wt 121 lb 11.2 oz (55.2 kg)   SpO2 100%   BMI 18.50 kg/m  Wt Readings from Last 3 Encounters:  06/28/20 121 lb 11.2 oz (55.2 kg)  07/28/19 122 lb (55.3 kg)  07/04/19 119 lb (54 kg)    Health Maintenance Due  Topic Date Due  . Hepatitis C Screening  Never done  . HIV Screening  Never done  . COLONOSCOPY  08/17/2016  . INFLUENZA VACCINE  Never done    There are no preventive care reminders to display for this patient.   No results found for: TSH Lab Results  Component Value Date   WBC 8.3 07/28/2019   HGB 12.6 (L) 07/28/2019   HCT 36.0 (L) 07/28/2019   MCV 99.7 07/28/2019   PLT 367 07/28/2019   Lab Results  Component Value Date   NA 134 (L) 07/28/2019   K 5.0 07/28/2019   CO2 28 07/28/2019   GLUCOSE 95 07/28/2019   BUN 23 07/28/2019   CREATININE 0.78 07/28/2019   BILITOT 0.5 07/28/2019   ALKPHOS 77 07/04/2019   AST 29 07/28/2019   ALT 28 07/28/2019   PROT 7.4 07/28/2019   ALBUMIN 4.3 07/04/2019   CALCIUM 9.8 07/28/2019   Lab Results  Component Value Date   CHOL 198 10/13/2011   Lab Results  Component Value Date   HDL 57 10/13/2011   Lab Results  Component Value Date   LDLCALC 110 10/13/2011   Lab Results  Component Value Date   TRIG 155 10/13/2011   No results found for: CHOLHDL No results found for: HGBA1C     Assessment & Plan:   Problem List Items Addressed This Visit      Cardiovascular and Mediastinum   Essential hypertension, benign   Relevant Medications   amLODipine (NORVASC) 5 MG tablet   Other Relevant Orders   COMPLETE METABOLIC PANEL WITH GFR   Lipid panel   CBC     Respiratory   COPD exacerbation (HCC) - Primary   Relevant Medications   albuterol (VENTOLIN HFA) 108 (90 Base) MCG/ACT inhaler   predniSONE (DELTASONE) 20 MG tablet     Other   Elevated liver enzymes   Relevant Orders   Lipid panel    Other Visit Diagnoses    Hordeolum externum of right upper eyelid         COPD exacerbation-we will treat with prednisone and refill his albuterol so that he can go ahead and do 2 puffs every 6 hours as needed over the next couple of days and then taper down as he is hopefully doing well.  He says he already feels a little better after wearing a mask to work.  So encouraged him to continue to do that especially as long as he is being exposed to any type of inhaler in the year.  Stye right upper eyelid - continue with warm compresses also give him a topical antibiotic ointment, erythromycin.  If not better over the next 2 weeks then recommend that he get in with an eye doctor for further evaluation.   Uncontrolled hypertension he is not currently on blood pressure medication that he was prescribed amlodipine a couple of years ago.  Explained to him that it is really important that we control his blood pressure.  Encouraged him to restart medication and also get updated blood work.  Meds ordered this encounter  Medications  . albuterol (VENTOLIN HFA) 108 (90 Base) MCG/ACT inhaler    Sig: Inhale 2 puffs into the lungs every 6 (six) hours as needed for wheezing or shortness of breath.    Dispense:  8 g    Refill:  2  . predniSONE (DELTASONE) 20 MG tablet    Sig:  Take 2 tablets (40 mg total) by mouth daily with breakfast.    Dispense:  10 tablet    Refill:  0  . amLODipine (NORVASC) 5 MG tablet    Sig: Take 1 tablet (5 mg total) by mouth daily.    Dispense:  30 tablet    Refill:  3  . erythromycin ophthalmic ointment    Sig: Apply small amount BID to right upper eyelid    Dispense:  3.5 g    Refill:  0     Beatrice Lecher, MD

## 2020-10-02 DIAGNOSIS — H04123 Dry eye syndrome of bilateral lacrimal glands: Secondary | ICD-10-CM | POA: Diagnosis not present

## 2020-11-13 DIAGNOSIS — R0789 Other chest pain: Secondary | ICD-10-CM | POA: Diagnosis not present

## 2020-11-13 DIAGNOSIS — Z79899 Other long term (current) drug therapy: Secondary | ICD-10-CM | POA: Diagnosis not present

## 2020-11-13 DIAGNOSIS — R059 Cough, unspecified: Secondary | ICD-10-CM | POA: Diagnosis not present

## 2020-11-13 DIAGNOSIS — R0602 Shortness of breath: Secondary | ICD-10-CM | POA: Diagnosis not present

## 2020-11-13 DIAGNOSIS — I700 Atherosclerosis of aorta: Secondary | ICD-10-CM | POA: Diagnosis not present

## 2020-11-13 DIAGNOSIS — I10 Essential (primary) hypertension: Secondary | ICD-10-CM | POA: Diagnosis not present

## 2020-11-13 DIAGNOSIS — E871 Hypo-osmolality and hyponatremia: Secondary | ICD-10-CM | POA: Diagnosis not present

## 2020-11-13 DIAGNOSIS — F419 Anxiety disorder, unspecified: Secondary | ICD-10-CM | POA: Diagnosis not present

## 2020-11-13 DIAGNOSIS — R079 Chest pain, unspecified: Secondary | ICD-10-CM | POA: Diagnosis not present

## 2020-11-13 DIAGNOSIS — J449 Chronic obstructive pulmonary disease, unspecified: Secondary | ICD-10-CM | POA: Diagnosis not present

## 2020-11-13 DIAGNOSIS — F17200 Nicotine dependence, unspecified, uncomplicated: Secondary | ICD-10-CM | POA: Diagnosis not present

## 2020-12-28 ENCOUNTER — Other Ambulatory Visit: Payer: Self-pay | Admitting: Family Medicine

## 2020-12-28 DIAGNOSIS — J441 Chronic obstructive pulmonary disease with (acute) exacerbation: Secondary | ICD-10-CM

## 2021-01-28 ENCOUNTER — Other Ambulatory Visit: Payer: Self-pay

## 2021-01-28 ENCOUNTER — Encounter: Payer: Self-pay | Admitting: Physician Assistant

## 2021-01-28 ENCOUNTER — Ambulatory Visit (INDEPENDENT_AMBULATORY_CARE_PROVIDER_SITE_OTHER): Payer: BC Managed Care – PPO | Admitting: Physician Assistant

## 2021-01-28 VITALS — BP 185/89 | HR 94 | Ht 68.0 in | Wt 122.0 lb

## 2021-01-28 DIAGNOSIS — Z720 Tobacco use: Secondary | ICD-10-CM

## 2021-01-28 DIAGNOSIS — H0100A Unspecified blepharitis right eye, upper and lower eyelids: Secondary | ICD-10-CM | POA: Diagnosis not present

## 2021-01-28 DIAGNOSIS — J449 Chronic obstructive pulmonary disease, unspecified: Secondary | ICD-10-CM

## 2021-01-28 DIAGNOSIS — F17200 Nicotine dependence, unspecified, uncomplicated: Secondary | ICD-10-CM

## 2021-01-28 DIAGNOSIS — J441 Chronic obstructive pulmonary disease with (acute) exacerbation: Secondary | ICD-10-CM

## 2021-01-28 DIAGNOSIS — H6121 Impacted cerumen, right ear: Secondary | ICD-10-CM

## 2021-01-28 DIAGNOSIS — I10 Essential (primary) hypertension: Secondary | ICD-10-CM | POA: Diagnosis not present

## 2021-01-28 DIAGNOSIS — H60501 Unspecified acute noninfective otitis externa, right ear: Secondary | ICD-10-CM

## 2021-01-28 MED ORDER — OFLOXACIN 0.3 % OT SOLN: 10.0000 [drp] | Freq: Every day | OTIC | 0 refills | Status: AC

## 2021-01-28 MED ORDER — ALBUTEROL SULFATE HFA 108 (90 BASE) MCG/ACT IN AERS: 2.0000 | INHALATION_SPRAY | Freq: Four times a day (QID) | RESPIRATORY_TRACT | 5 refills | Status: AC | PRN

## 2021-01-28 MED ORDER — BUDESONIDE-FORMOTEROL FUMARATE 160-4.5 MCG/ACT IN AERO: 2.0000 | INHALATION_SPRAY | Freq: Two times a day (BID) | RESPIRATORY_TRACT | 3 refills | Status: DC

## 2021-01-28 MED ORDER — AMLODIPINE BESYLATE 5 MG PO TABS: 5.0000 mg | ORAL_TABLET | Freq: Every day | ORAL | 0 refills | Status: AC

## 2021-01-28 MED ORDER — ERYTHROMYCIN 5 MG/GM OP OINT: 1.0000 "application " | TOPICAL_OINTMENT | Freq: Three times a day (TID) | OPHTHALMIC | 0 refills | Status: AC

## 2021-01-28 NOTE — Patient Instructions (Addendum)
   Blepharitis Blepharitis is inflammation of the eyelids. Blepharitis may happen with: Reddish, scaly skin around the scalp and eyebrows. Burning or itching of the eyelids. Eye discharge at night that causes the eyelashes to stick together in the morning. Eyelashes that fall out. Sensitivity to light. Follow these instructions at home: Pay attention to any changes in how your eyes look or feel. Tell your health care provider about any changes. Follow these instructions to help with yourcondition. Keeping [employer] your hands often. Wash your eyelids with warm water or with warm water that is mixed with a small amount of baby shampoo. Do this two times per day or as often as needed. Wash your face and eyebrows at least once a day. Use a clean towel each time you dry your eyelids. Do not use this towel to clean or dry other areas of your body. Do not share your towel with anyone.  General instructions Avoid wearing makeup until you get better. Do not share makeup with anyone. Avoid rubbing your eyes. Apply warm compresses to your eyes 2 times per day for 10 minutes at a time, or as told by your health care provider. If you were prescribed an antibiotic ointment or steroid drops, apply or use the medicine as told by your health care provider. Do not stop using the medicine even if you feel better. Keep all follow-up visits as told by your health care provider. This is important. Contact a health care provider if: Your eyelids feel hot. You have blisters or a rash on your eyelids. The condition does not go away in 2-4 days. The inflammation gets worse. Get help right away if: You have pain or redness that gets worse or spreads to other parts of your face. Your vision changes. You have pain when looking at lights or moving objects. You have a fever. Summary Blepharitis is inflammation of the eyelids. Pay attention to any changes in how your eyes look or feel. Tell your health care  provider about any changes. Follow home care instructions as told by your health care provider. Wash your hands often. Avoid wearing makeup. Do not rub your eyes. To treat this condition, use warm compresses and prescription ointments or eye drops. Let your health care provider know if you have vision changes, blisters or rash on eyelids, pain that spreads to your face, or warmth on your eyelids. This information is not intended to replace advice given to you by your health care provider. Make sure you discuss any questions you have with your healthcare provider. Document Revised: 01/24/2018 Document Reviewed: 01/24/2018 Elsevier Patient Education  [place].

## 2021-01-28 NOTE — Progress Notes (Signed)
Subjective:    Patient ID: Jason Page, male    DOB: 1956/04/29, 65 y.o.   MRN: [id number]  HPI Pt is a 65 yo male with HTN, COPD who presents to the clinic with multiple concerns.   Patient's main concern is his right upper and lower eyelid that are irritated and have crusted discharge on them.  This is been ongoing for the last week or so.  Feels like there is more swelling and even a stye forming.  Denies any fever, chills, sinus pressure.  Patient is having a lot of right ear pressure and hearing loss as well as ringing in the ear.  This is been going on for a few days.  He denies any ear discharge.  Patient requests refill of albuterol.  He is using this daily and multiple times a day.  He is having significant problems with shortness of breath.  He continues to smoke.  He is smoked since he was 65 years old at least 1 pack a day.  He is trying to stop smoking.  He has not been on a daily inhaler.  He admits he has not taken his blood pressure medication.  He denies any chest pain, palpitations, headache or vision changes.   .. Active Ambulatory Problems    Diagnosis Date Noted   TOBACCO DEPENDENCE 05/25/2006   PAIN IN JOINT, ANKLE/FOOT 01/18/2007   LOW BACK PAIN 05/25/2006   Right knee pain 09/12/2012   Essential hypertension, benign 09/20/2013   Tobacco abuse 09/20/2013   Allergic conjunctivitis of both eyes 12/06/2015   COPD exacerbation ([place]) 09/07/2016   Basal cell carcinoma (BCC) of face 04/07/2019   Radial nerve palsy, right 07/28/2019   Skin lesion of right arm 07/28/2019   Elevated liver enzymes 06/28/2020   Right ear impacted cerumen 01/28/2021   Chronic obstructive pulmonary disease ([place]) 01/28/2021   Current smoker 01/28/2021   Blepharitis of both upper and lower eyelid of right eye 01/28/2021   Resolved Ambulatory Problems    Diagnosis Date Noted   Pain in joint, lower leg 10/22/2009   HOARSENESS 09/05/2007   Cat bite 03/21/2013   Stye 12/06/2015    Bilateral conjunctivitis 12/06/2015   Callus of right great toe 10/30/2016   Influenza-like illness 10/05/2017   Past Medical History:  Diagnosis Date   Hypertension    Lumbar herniated disc     Review of Systems See HPI.     Objective:   Physical Exam Vitals reviewed.  Constitutional:      Appearance: Normal appearance.  HENT:     Head: Normocephalic.     Right Ear: There is impacted cerumen.     Left Ear: Tympanic membrane, ear canal and external ear normal. There is no impacted cerumen.     Nose: Nose normal.     Mouth/Throat:     Mouth: Mucous membranes are moist.  Eyes:     General:        Right eye: Discharge present.        Left eye: No discharge.     Extraocular Movements: Extraocular movements intact.     Conjunctiva/sclera: Conjunctivae normal.     Pupils: Pupils are equal, round, and reactive to light.     Comments: Right upper and lower eyelid erythematous with stye upper lateral eyelid.   Cardiovascular:     Rate and Rhythm: Normal rate and regular rhythm.     Pulses: Normal pulses.  Pulmonary:     Effort: Pulmonary effort is  normal.     Breath sounds: Rhonchi present.     Comments: Coarse breath sounds Musculoskeletal:     Cervical back: Normal range of motion.  Neurological:     General: No focal deficit present.     Mental Status: He is alert and oriented to person, place, and time.  Psychiatric:        Mood and Affect: Mood normal.          Assessment & Plan:  Marland KitchenMarland KitchenHaniel was seen today for ear problem.  Diagnoses and all orders for this visit:  Blepharitis of both upper and lower eyelid of right eye, unspecified type -     erythromycin ophthalmic ointment; Place 1 application into the right eye 3 (three) times daily for 5 days.  Essential hypertension, benign -     amLODipine (NORVASC) 5 MG tablet; Take 1 tablet (5 mg total) by mouth daily.  Right ear impacted cerumen  Chronic obstructive pulmonary disease, unspecified COPD type  ([place]) -     budesonide-formoterol (SYMBICORT) 160-4.5 MCG/ACT inhaler; Inhale 2 puffs into the lungs 2 (two) times daily. -     Ambulatory Referral for Lung Cancer Scre  Tobacco abuse -     Ambulatory Referral for Lung Cancer Scre  Current smoker -     Ambulatory Referral for Lung Cancer Scre  COPD exacerbation (HCC) -     albuterol (VENTOLIN HFA) 108 (90 Base) MCG/ACT inhaler; Inhale 2 puffs into the lungs every 6 (six) hours as needed for wheezing or shortness of breath.  Acute otitis externa of right ear, unspecified type -     ofloxacin (FLOXIN) 0.3 % OTIC solution; Place 10 drops into the right ear daily. For 7 days.  For blepharitis sent erythromycin ointment to use 3 times a day for 5 days.  Continue cool compresses.  Follow-up as needed or symptoms persist.  ..Cerumen Removal Template: Indication: Cerumen impaction of the ear(s) Medical necessity statement: On physical examination, cerumen impairs clinically significant portions of the external auditory canal, and tympanic membrane. Noted obstructive, copious cerumen that cannot be removed without magnification and instrumentations requiring physician skills Consent: Discussed benefits and risks of procedure and verbal consent obtained Procedure: Patient was prepped for the procedure. Utilized an otoscope to assess and take note of the ear canal, the tympanic membrane, and the presence, amount, and placement of the cerumen. Gentle water irrigation and soft plastic curette was utilized to remove cerumen.  Post procedure examination shows cerumen was completely removed. Patient tolerated procedure well. The patient is made aware that they may experience temporary vertigo, temporary hearing loss, and temporary discomfort. If these symptom last for more than 24 hours to call the clinic or proceed to the ED.  Right ear was irrigated today.  After irrigation significant erythema and scant purulent material in the canal.  Sent over 7 days  of ofloxacin.  Discuss elevated blood pressure with patient.  This is very important to control.  Restart Norvasc and follow-up with Jason Page his PCP in 4 weeks.  Sent refill.  Patient is an everyday smoker.  He does not been staged with COPD but certainly has it.  We will start Symbicort with albuterol as needed. Follow up in 4 weeks.  Would qualify for lung cancer screening referral made.  Discussed patches. Cut down by at least 8 cigs when starting the patches. Weekly cut down until can go to next dose of patch.   Follow up in 4 weeks.   Spent 40  minutes with patient reviewing chart, discussing plan and smoking cessation and medications.

## 2021-03-04 ENCOUNTER — Encounter: Payer: Self-pay | Admitting: Medical-Surgical

## 2021-03-04 ENCOUNTER — Telehealth: Payer: Self-pay

## 2021-03-04 ENCOUNTER — Ambulatory Visit (INDEPENDENT_AMBULATORY_CARE_PROVIDER_SITE_OTHER): Payer: BC Managed Care – PPO | Admitting: Medical-Surgical

## 2021-03-04 ENCOUNTER — Other Ambulatory Visit: Payer: Self-pay

## 2021-03-04 ENCOUNTER — Ambulatory Visit (INDEPENDENT_AMBULATORY_CARE_PROVIDER_SITE_OTHER): Payer: BC Managed Care – PPO

## 2021-03-04 VITALS — BP 178/65 | HR 108 | Temp 98.6°F | Ht 68.0 in | Wt 119.8 lb

## 2021-03-04 DIAGNOSIS — R058 Other specified cough: Secondary | ICD-10-CM | POA: Diagnosis not present

## 2021-03-04 DIAGNOSIS — R0602 Shortness of breath: Secondary | ICD-10-CM | POA: Diagnosis not present

## 2021-03-04 DIAGNOSIS — Z1152 Encounter for screening for COVID-19: Secondary | ICD-10-CM

## 2021-03-04 DIAGNOSIS — J441 Chronic obstructive pulmonary disease with (acute) exacerbation: Secondary | ICD-10-CM | POA: Diagnosis not present

## 2021-03-04 DIAGNOSIS — R059 Cough, unspecified: Secondary | ICD-10-CM | POA: Diagnosis not present

## 2021-03-04 LAB — POCT INFLUENZA A/B
Influenza A, POC: NEGATIVE
Influenza B, POC: NEGATIVE

## 2021-03-04 MED ORDER — METHYLPREDNISOLONE SODIUM SUCC 125 MG IJ SOLR
125.0000 mg | Freq: Once | INTRAMUSCULAR | Status: DC
  Administered 2021-03-04: 125 mg via INTRAVENOUS

## 2021-03-04 MED ORDER — METHYLPREDNISOLONE SODIUM SUCC 125 MG IJ SOLR: 125.0000 mg | Freq: Once | INTRAMUSCULAR | Status: AC

## 2021-03-04 MED ORDER — PREDNISONE 50 MG PO TABS: 50.0000 mg | ORAL_TABLET | Freq: Every day | ORAL | 0 refills | Status: AC

## 2021-03-04 MED ORDER — AMOXICILLIN-POT CLAVULANATE 875-125 MG PO TABS: 1.0000 | ORAL_TABLET | Freq: Two times a day (BID) | ORAL | 0 refills | Status: AC

## 2021-03-04 NOTE — Telephone Encounter (Signed)
Pt was out of work last Wednesday - Saturday, then went to work Monday, and then was out today. Pt was signed up for MyChart while on the phone and is requesting the letter be sent through his MyChart account.

## 2021-03-04 NOTE — Telephone Encounter (Signed)
[place] (1st attempt)    Pt was seen this morning and did not ask for a work note during his appt. In order to write this note, we need to know what days he missed from work. He said that he missed some days but did go to work yesterday. Also, he left an e-mail for his employer to send the work note to. We are not able to e-mail this medical information directly to them. He can come by and pick up the note or we can e-mail it to him at the e-mail listed in his chart:: [email or web address]

## 2021-03-04 NOTE — Progress Notes (Signed)
  HPI with pertinent ROS:   CC: Shortness of breath, weakness  HPI: Pleasant 65 year old male presenting today for shortness of breath, weakness, rhinorrhea, and generalized malaise that has been present for a few days.  Notes his wife was diagnosed with pneumonia a little over a week ago.  He did COVID test approximately 2 weeks ago with negative results.  States he also stopped smoking and drinking alcohol 2 weeks ago.  He has missed some time from work due to his illness.  Notes that he is having difficulty catching his breath and he does have a cough that is productive of creamy white color mucus.  He does work in [employer] and notes that he could have been exposed to illness anywhere.  Denies fever, chills, chest pain, and GI symptoms.  Has not tried any over-the-counter medications.  He is using his albuterol inhaler as well as the Symbicort.  He was significantly short of breath on arrival to the appointment today but had not used his inhalers yet so did that while he was waiting.  I reviewed the past medical history, family history, social history, surgical history, and allergies today and no changes were needed.  Please see the problem list section below in epic for further details.   Physical exam:   General: Well Developed, well nourished, and in no acute distress.  Neuro: Alert and oriented x3.  HEENT: Normocephalic, atraumatic.  Skin: Warm and dry. Cardiac: Regular rate and rhythm, no murmurs rubs or gallops, no lower extremity edema.  Respiratory: Scattered rhonchi to bilateral upper lobes posteriorly.  Bilateral lower lobes posteriorly with diminished breath sounds. Not using accessory muscles, speaking in full sentences.  Impression and Recommendations:    1. Shortness of breath 2. Encounter for screening for COVID-19 3. Productive cough COVID test performed.  Flu swab negative.  Getting chest x-ray today.   - Novel Coronavirus, NAA ([facility]) - POCT Influenza A/B - DG  Chest 2 View; Future  3. COPD exacerbation ([place]) Getting chest x-ray today.  Solu-Medrol 125 mg in office x1 today.  Starting prednisone 50 mg daily x5 days tomorrow.  Sending in Augmentin twice daily x7 days. - DG Chest 2 View; Future - methylPREDNISolone sodium succinate (SOLU-MEDROL) 125 mg/2 mL injection 125 mg  Return if symptoms worsen or fail to improve. ___________________________________________ Clearnce Sorrel, DNP, APRN, FNP-BC [facility] [place]

## 2021-03-05 ENCOUNTER — Telehealth: Payer: Self-pay | Admitting: Neurology

## 2021-03-05 LAB — SARS-COV-2, NAA 2 DAY TAT

## 2021-03-05 LAB — NOVEL CORONAVIRUS, NAA: SARS-CoV-2, NAA: NOT DETECTED

## 2021-03-05 NOTE — Telephone Encounter (Signed)
Pt stopped by the office this morning and picked up the original of the letter. No further questions or concerns at this time.

## 2021-03-05 NOTE — Telephone Encounter (Signed)
Prior Authorization for Symbicort submitted via covermymeds. Awaiting response. Your information has been submitted to [place]. [employer] will review the request and notify you of the determination decision directly, typically within 72 hours of receiving all information.  You will also receive your request decision electronically. To check for an update later, open this request again from your dashboard.  If [employer] [facility] has not responded within the specified timeframe or if you have any questions about your PA submission, contact [place]  directly at [phone].

## 2021-03-07 NOTE — Telephone Encounter (Signed)
Received message from [place]: "Please note authorization is not required for this medication on the member's plan. On drug list, Tier 3, per pharmacy system the patient filled this request on 03/03/2021, 02/22/2021, 01/28/2021. This is only authorized every 30 days.

## 2021-03-12 DIAGNOSIS — Z8659 Personal history of other mental and behavioral disorders: Secondary | ICD-10-CM | POA: Diagnosis not present

## 2021-03-12 DIAGNOSIS — E860 Dehydration: Secondary | ICD-10-CM | POA: Diagnosis not present

## 2021-03-12 DIAGNOSIS — M25552 Pain in left hip: Secondary | ICD-10-CM | POA: Diagnosis not present

## 2021-03-12 DIAGNOSIS — S79912A Unspecified injury of left hip, initial encounter: Secondary | ICD-10-CM | POA: Diagnosis not present

## 2021-03-12 DIAGNOSIS — J432 Centrilobular emphysema: Secondary | ICD-10-CM | POA: Diagnosis not present

## 2021-03-12 DIAGNOSIS — Z9889 Other specified postprocedural states: Secondary | ICD-10-CM | POA: Diagnosis not present

## 2021-03-12 DIAGNOSIS — S72142A Displaced intertrochanteric fracture of left femur, initial encounter for closed fracture: Secondary | ICD-10-CM | POA: Diagnosis not present

## 2021-03-12 DIAGNOSIS — Z720 Tobacco use: Secondary | ICD-10-CM | POA: Diagnosis not present

## 2021-03-12 DIAGNOSIS — D62 Acute posthemorrhagic anemia: Secondary | ICD-10-CM | POA: Diagnosis not present

## 2021-03-12 DIAGNOSIS — S7222XA Displaced subtrochanteric fracture of left femur, initial encounter for closed fracture: Secondary | ICD-10-CM | POA: Diagnosis not present

## 2021-03-12 DIAGNOSIS — W010XXA Fall on same level from slipping, tripping and stumbling without subsequent striking against object, initial encounter: Secondary | ICD-10-CM | POA: Diagnosis not present

## 2021-03-12 DIAGNOSIS — Z043 Encounter for examination and observation following other accident: Secondary | ICD-10-CM | POA: Diagnosis not present

## 2021-03-12 DIAGNOSIS — Y990 Civilian activity done for income or pay: Secondary | ICD-10-CM | POA: Diagnosis not present

## 2021-03-12 DIAGNOSIS — I10 Essential (primary) hypertension: Secondary | ICD-10-CM | POA: Diagnosis not present

## 2021-03-12 DIAGNOSIS — S72142D Displaced intertrochanteric fracture of left femur, subsequent encounter for closed fracture with routine healing: Secondary | ICD-10-CM | POA: Diagnosis not present

## 2021-03-12 DIAGNOSIS — R296 Repeated falls: Secondary | ICD-10-CM | POA: Diagnosis not present

## 2021-03-12 DIAGNOSIS — I2510 Atherosclerotic heart disease of native coronary artery without angina pectoris: Secondary | ICD-10-CM | POA: Diagnosis not present

## 2021-03-12 DIAGNOSIS — E871 Hypo-osmolality and hyponatremia: Secondary | ICD-10-CM | POA: Diagnosis not present

## 2021-03-12 DIAGNOSIS — Z79899 Other long term (current) drug therapy: Secondary | ICD-10-CM | POA: Diagnosis not present

## 2021-03-12 DIAGNOSIS — Y9269 Other specified industrial and construction area as the place of occurrence of the external cause: Secondary | ICD-10-CM | POA: Diagnosis not present

## 2021-03-12 DIAGNOSIS — R918 Other nonspecific abnormal finding of lung field: Secondary | ICD-10-CM | POA: Diagnosis not present

## 2021-03-18 ENCOUNTER — Inpatient Hospital Stay: Payer: BC Managed Care – PPO | Admitting: Family Medicine

## 2021-04-23 ENCOUNTER — Telehealth: Payer: Self-pay | Admitting: Neurology

## 2021-04-23 NOTE — Telephone Encounter (Signed)
Patient left vm for a call back. No information left. Tried to call back and LMOM.

## 2021-04-23 NOTE — Telephone Encounter (Signed)
Patient called again with CB number [phone]

## 2021-04-24 NOTE — Telephone Encounter (Signed)
Spoke with patient and he had needed refills of inhalers but realized he already had refills at the pharmacy. Patient made aware when he needs refills to call back and made follow up with Dr. Madilyn Fireman. He expressed understanding.

## 2021-05-28 ENCOUNTER — Other Ambulatory Visit: Payer: Self-pay | Admitting: Physician Assistant

## 2021-05-28 DIAGNOSIS — J449 Chronic obstructive pulmonary disease, unspecified: Secondary | ICD-10-CM

## 2021-06-16 DIAGNOSIS — G911 Obstructive hydrocephalus: Secondary | ICD-10-CM | POA: Diagnosis not present

## 2021-06-16 DIAGNOSIS — R4020 Unspecified coma: Secondary | ICD-10-CM | POA: Diagnosis not present

## 2021-06-16 DIAGNOSIS — R0689 Other abnormalities of breathing: Secondary | ICD-10-CM | POA: Diagnosis not present

## 2021-06-16 DIAGNOSIS — I629 Nontraumatic intracranial hemorrhage, unspecified: Secondary | ICD-10-CM | POA: Diagnosis not present

## 2021-06-16 DIAGNOSIS — I613 Nontraumatic intracerebral hemorrhage in brain stem: Secondary | ICD-10-CM | POA: Diagnosis not present

## 2021-06-16 DIAGNOSIS — R404 Transient alteration of awareness: Secondary | ICD-10-CM | POA: Diagnosis not present

## 2021-06-16 DIAGNOSIS — R062 Wheezing: Secondary | ICD-10-CM | POA: Diagnosis not present

## 2021-06-16 DIAGNOSIS — I615 Nontraumatic intracerebral hemorrhage, intraventricular: Secondary | ICD-10-CM | POA: Diagnosis not present

## 2021-06-16 DIAGNOSIS — Z4659 Encounter for fitting and adjustment of other gastrointestinal appliance and device: Secondary | ICD-10-CM | POA: Diagnosis not present

## 2021-06-16 DIAGNOSIS — R4189 Other symptoms and signs involving cognitive functions and awareness: Secondary | ICD-10-CM | POA: Diagnosis not present

## 2021-06-16 DIAGNOSIS — R4182 Altered mental status, unspecified: Secondary | ICD-10-CM | POA: Diagnosis not present

## 2021-06-16 DIAGNOSIS — R000 Tachycardia, unspecified: Secondary | ICD-10-CM | POA: Diagnosis not present

## 2021-06-16 DIAGNOSIS — I619 Nontraumatic intracerebral hemorrhage, unspecified: Secondary | ICD-10-CM | POA: Diagnosis not present

## 2021-06-17 DIAGNOSIS — J9601 Acute respiratory failure with hypoxia: Secondary | ICD-10-CM | POA: Diagnosis not present

## 2021-06-17 DIAGNOSIS — I613 Nontraumatic intracerebral hemorrhage in brain stem: Secondary | ICD-10-CM | POA: Diagnosis not present

## 2021-06-17 DIAGNOSIS — I615 Nontraumatic intracerebral hemorrhage, intraventricular: Secondary | ICD-10-CM | POA: Diagnosis not present

## 2021-06-17 DIAGNOSIS — G911 Obstructive hydrocephalus: Secondary | ICD-10-CM | POA: Diagnosis not present

## 2021-06-17 DIAGNOSIS — S066X9A Traumatic subarachnoid hemorrhage with loss of consciousness of unspecified duration, initial encounter: Secondary | ICD-10-CM | POA: Diagnosis not present

## 2021-06-17 DIAGNOSIS — R4189 Other symptoms and signs involving cognitive functions and awareness: Secondary | ICD-10-CM | POA: Diagnosis not present

## 2021-06-18 DIAGNOSIS — J9601 Acute respiratory failure with hypoxia: Secondary | ICD-10-CM | POA: Diagnosis not present

## 2021-06-18 DIAGNOSIS — G911 Obstructive hydrocephalus: Secondary | ICD-10-CM | POA: Diagnosis not present

## 2021-06-18 DIAGNOSIS — I629 Nontraumatic intracranial hemorrhage, unspecified: Secondary | ICD-10-CM | POA: Diagnosis not present

## 2021-06-18 DIAGNOSIS — I615 Nontraumatic intracerebral hemorrhage, intraventricular: Secondary | ICD-10-CM | POA: Diagnosis not present

## 2021-06-18 DIAGNOSIS — I613 Nontraumatic intracerebral hemorrhage in brain stem: Secondary | ICD-10-CM | POA: Diagnosis not present

## 2021-06-19 DIAGNOSIS — J9601 Acute respiratory failure with hypoxia: Secondary | ICD-10-CM | POA: Diagnosis not present

## 2021-06-19 DIAGNOSIS — I615 Nontraumatic intracerebral hemorrhage, intraventricular: Secondary | ICD-10-CM | POA: Diagnosis not present

## 2021-06-19 DIAGNOSIS — G9340 Encephalopathy, unspecified: Secondary | ICD-10-CM | POA: Diagnosis not present

## 2021-06-19 DIAGNOSIS — R6511 Systemic inflammatory response syndrome (SIRS) of non-infectious origin with acute organ dysfunction: Secondary | ICD-10-CM | POA: Diagnosis not present

## 2021-06-19 DIAGNOSIS — I517 Cardiomegaly: Secondary | ICD-10-CM | POA: Diagnosis not present

## 2021-06-20 DIAGNOSIS — G9340 Encephalopathy, unspecified: Secondary | ICD-10-CM | POA: Diagnosis not present

## 2021-06-20 DIAGNOSIS — G911 Obstructive hydrocephalus: Secondary | ICD-10-CM | POA: Diagnosis not present

## 2021-06-20 DIAGNOSIS — I613 Nontraumatic intracerebral hemorrhage in brain stem: Secondary | ICD-10-CM | POA: Diagnosis not present

## 2021-06-20 DIAGNOSIS — I629 Nontraumatic intracranial hemorrhage, unspecified: Secondary | ICD-10-CM | POA: Diagnosis not present

## 2021-06-20 DIAGNOSIS — I161 Hypertensive emergency: Secondary | ICD-10-CM | POA: Diagnosis not present

## 2021-06-21 DIAGNOSIS — I613 Nontraumatic intracerebral hemorrhage in brain stem: Secondary | ICD-10-CM | POA: Diagnosis not present

## 2021-06-21 DIAGNOSIS — I161 Hypertensive emergency: Secondary | ICD-10-CM | POA: Diagnosis not present

## 2021-06-21 DIAGNOSIS — G911 Obstructive hydrocephalus: Secondary | ICD-10-CM | POA: Diagnosis not present

## 2021-06-21 DIAGNOSIS — G9340 Encephalopathy, unspecified: Secondary | ICD-10-CM | POA: Diagnosis not present

## 2021-06-22 DIAGNOSIS — J155 Pneumonia due to Escherichia coli: Secondary | ICD-10-CM | POA: Diagnosis not present

## 2021-06-22 DIAGNOSIS — J9601 Acute respiratory failure with hypoxia: Secondary | ICD-10-CM | POA: Diagnosis not present

## 2021-06-22 DIAGNOSIS — J156 Pneumonia due to other aerobic Gram-negative bacteria: Secondary | ICD-10-CM | POA: Diagnosis not present

## 2021-06-22 DIAGNOSIS — J151 Pneumonia due to Pseudomonas: Secondary | ICD-10-CM | POA: Diagnosis not present

## 2021-06-23 DIAGNOSIS — I615 Nontraumatic intracerebral hemorrhage, intraventricular: Secondary | ICD-10-CM | POA: Diagnosis not present

## 2021-06-23 DIAGNOSIS — G911 Obstructive hydrocephalus: Secondary | ICD-10-CM | POA: Diagnosis not present

## 2021-06-23 DIAGNOSIS — I629 Nontraumatic intracranial hemorrhage, unspecified: Secondary | ICD-10-CM | POA: Diagnosis not present

## 2021-06-23 DIAGNOSIS — G9340 Encephalopathy, unspecified: Secondary | ICD-10-CM | POA: Diagnosis not present

## 2021-06-23 DIAGNOSIS — I613 Nontraumatic intracerebral hemorrhage in brain stem: Secondary | ICD-10-CM | POA: Diagnosis not present

## 2021-06-24 DIAGNOSIS — G9340 Encephalopathy, unspecified: Secondary | ICD-10-CM | POA: Diagnosis not present

## 2021-06-24 DIAGNOSIS — I613 Nontraumatic intracerebral hemorrhage in brain stem: Secondary | ICD-10-CM | POA: Diagnosis not present

## 2021-06-24 DIAGNOSIS — I615 Nontraumatic intracerebral hemorrhage, intraventricular: Secondary | ICD-10-CM | POA: Diagnosis not present

## 2021-06-24 DIAGNOSIS — J9601 Acute respiratory failure with hypoxia: Secondary | ICD-10-CM | POA: Diagnosis not present

## 2021-06-24 DIAGNOSIS — I629 Nontraumatic intracranial hemorrhage, unspecified: Secondary | ICD-10-CM | POA: Diagnosis not present

## 2021-06-25 DIAGNOSIS — I613 Nontraumatic intracerebral hemorrhage in brain stem: Secondary | ICD-10-CM | POA: Diagnosis not present

## 2021-06-25 DIAGNOSIS — J9601 Acute respiratory failure with hypoxia: Secondary | ICD-10-CM | POA: Diagnosis not present

## 2021-06-25 DIAGNOSIS — I615 Nontraumatic intracerebral hemorrhage, intraventricular: Secondary | ICD-10-CM | POA: Diagnosis not present

## 2021-06-25 DIAGNOSIS — G9340 Encephalopathy, unspecified: Secondary | ICD-10-CM | POA: Diagnosis not present

## 2023-05-23 IMAGING — DX DG CHEST 2V
2 series · 2 of 2 positions shown · non-contrast
Comparison: 10/05/2017.

CLINICAL DATA: Severe shortness of breath.  Productive cough.

EXAM:
CHEST - 2 VIEW

[chest pa]
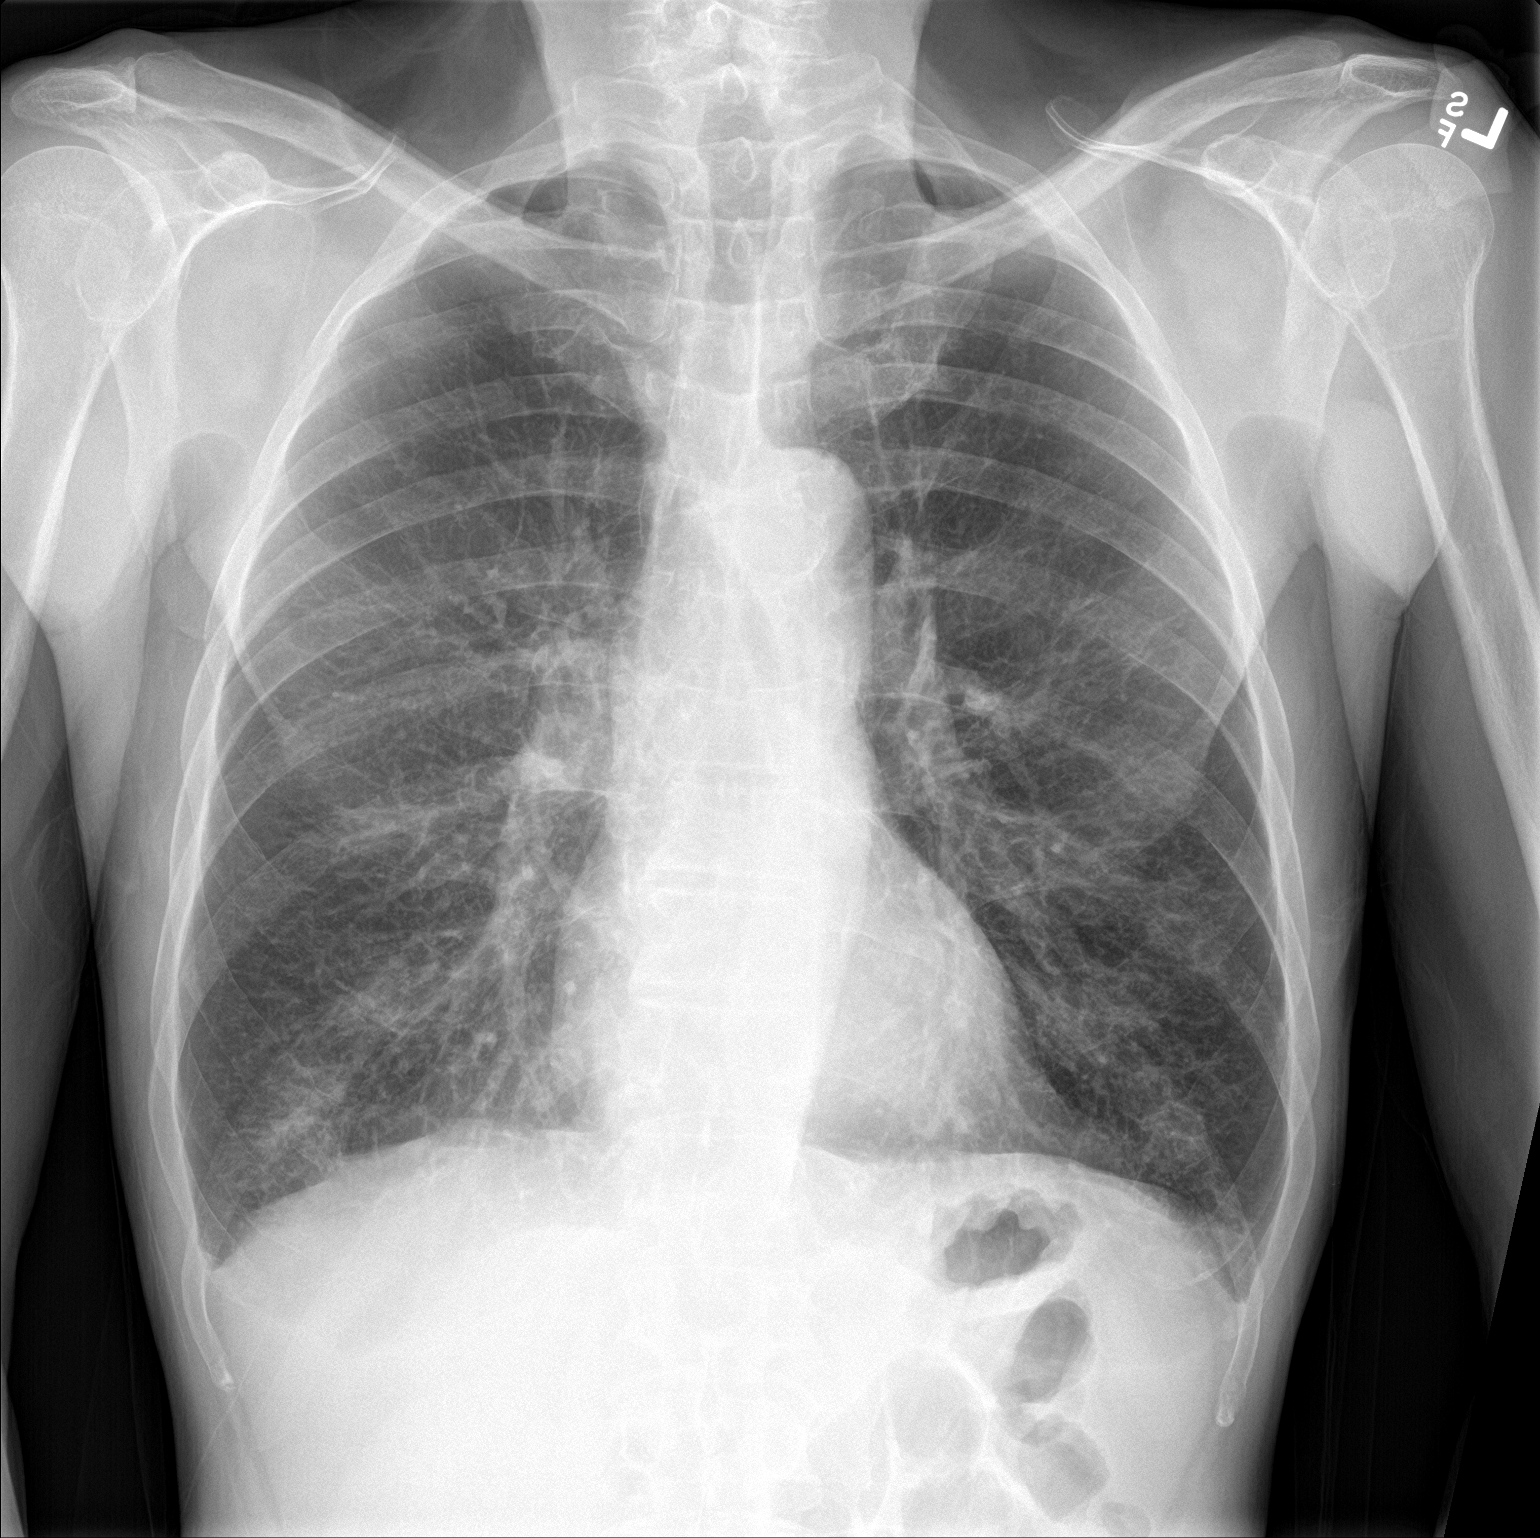

[chest lat]
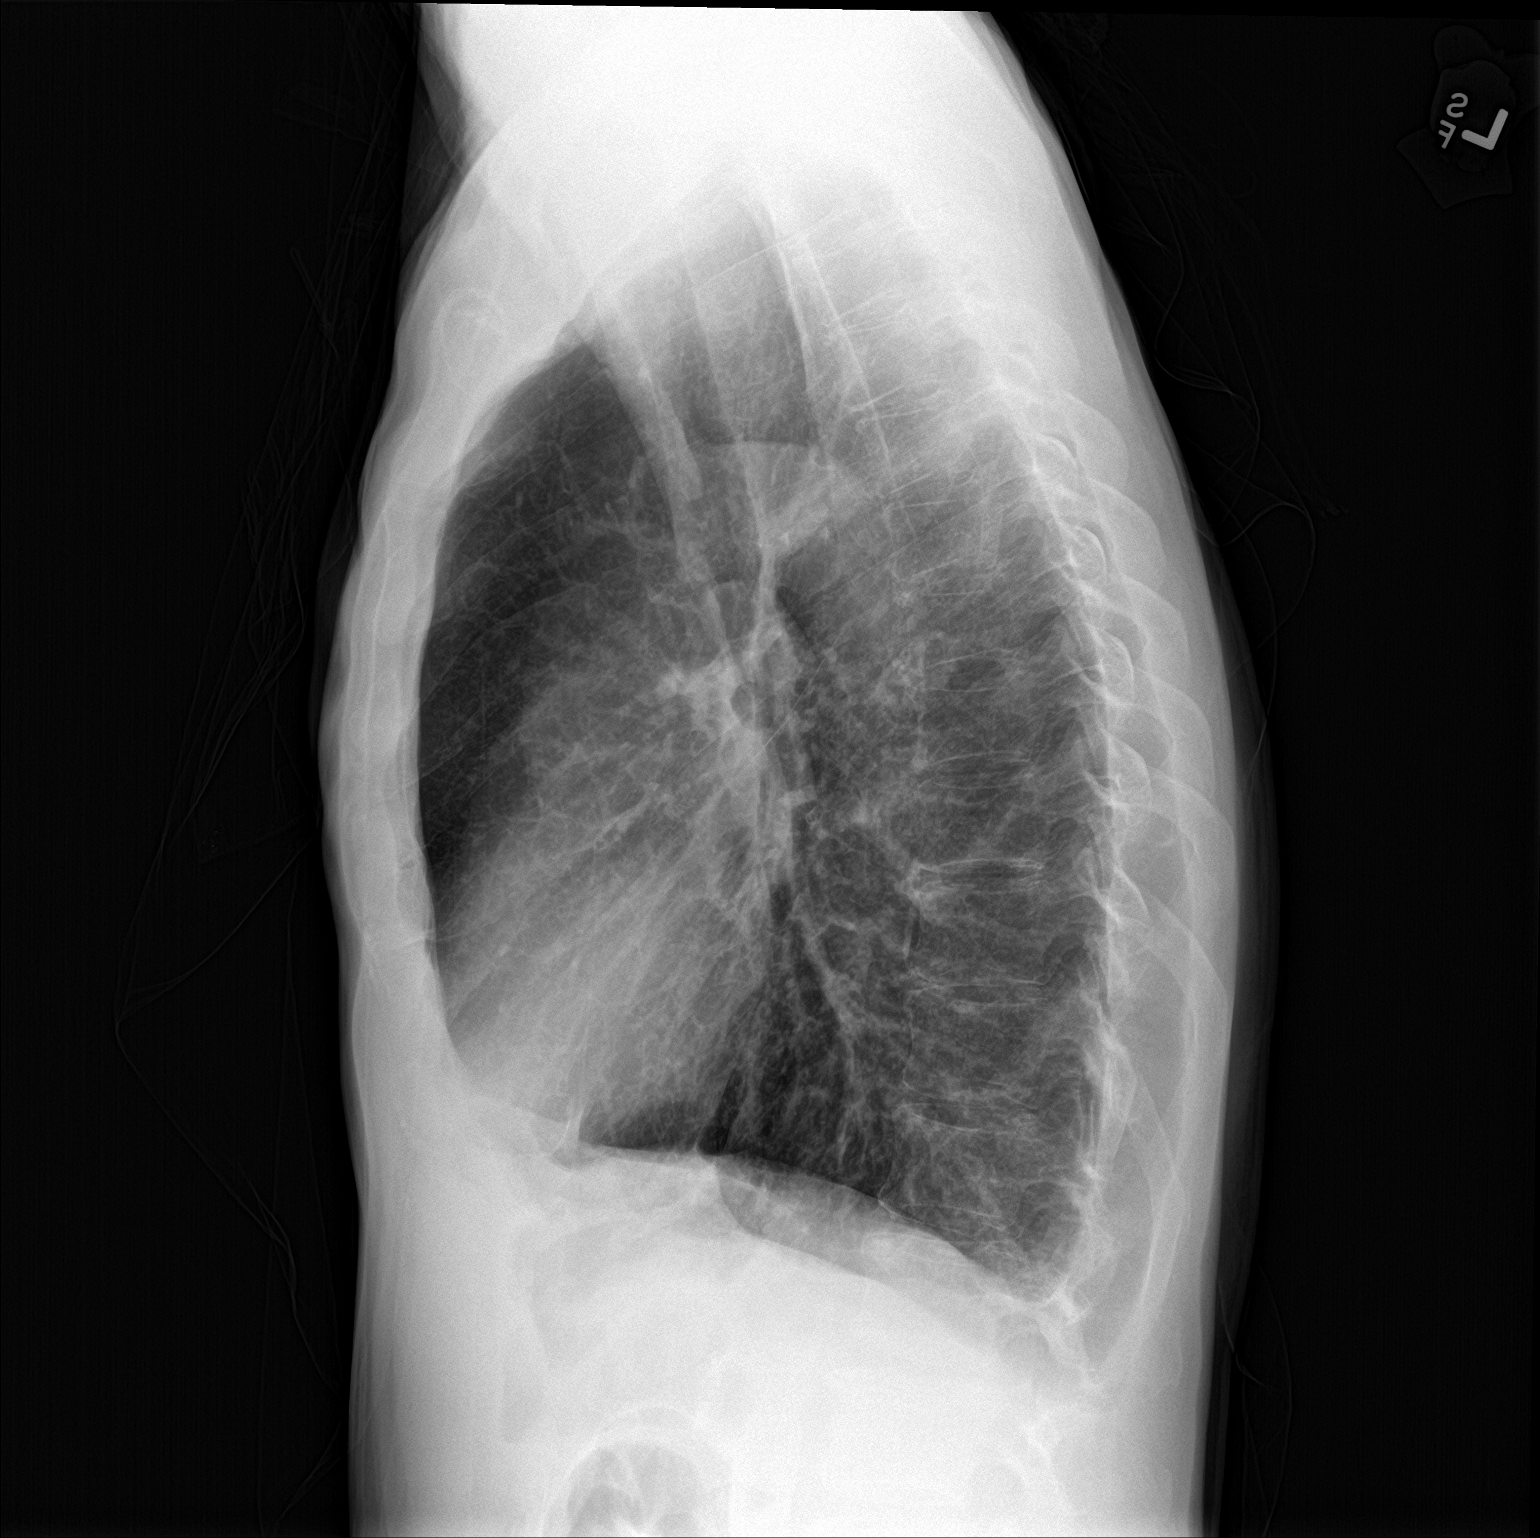

[2 of 2 positions shown; findings below may reference images not displayed]

FINDINGS: Mediastinum and hilar structures normal. Heart size normal.
Bilateral chronic interstitial changes are noted. Superimposed mild
infiltrate in the right lung base noted. Mild bilateral pleural
effusions cannot be excluded. No pneumothorax. Diffuse osteopenia.
No acute bony abnormality.
IMPRESSION: Bilateral chronic interstitial changes are noted. Superimposed mild
infiltrate right lung base noted. Mild bilateral pleural effusions
cannot be excluded.
